# Patient Record
Sex: Female | Born: 1965 | Race: Black or African American | Hispanic: No | Smoking: Never smoker
Health system: Southern US, Community
[De-identification: ages and names within clinical notes are randomized; demographics above are authoritative.]

## PROBLEM LIST (undated history)

## (undated) DIAGNOSIS — I10 Essential (primary) hypertension: Secondary | ICD-10-CM

## (undated) DIAGNOSIS — K219 Gastro-esophageal reflux disease without esophagitis: Secondary | ICD-10-CM

---

## 1997-12-06 ENCOUNTER — Other Ambulatory Visit: Admission: RE | Admit: 1997-12-06 | Discharge: 1997-12-06 | Payer: Self-pay | Admitting: Obstetrics

## 1997-12-08 ENCOUNTER — Ambulatory Visit (HOSPITAL_COMMUNITY): Admission: RE | Admit: 1997-12-08 | Discharge: 1997-12-08 | Payer: Self-pay | Admitting: Obstetrics

## 1998-01-09 ENCOUNTER — Inpatient Hospital Stay (HOSPITAL_COMMUNITY): Admission: AD | Admit: 1998-01-09 | Discharge: 1998-01-09 | Payer: Self-pay | Admitting: Obstetrics

## 1998-04-17 ENCOUNTER — Ambulatory Visit (HOSPITAL_COMMUNITY): Admission: RE | Admit: 1998-04-17 | Discharge: 1998-04-17 | Payer: Self-pay | Admitting: Obstetrics

## 1998-05-08 ENCOUNTER — Ambulatory Visit (HOSPITAL_COMMUNITY): Admission: RE | Admit: 1998-05-08 | Discharge: 1998-05-08 | Payer: Self-pay | Admitting: Obstetrics

## 1998-07-30 ENCOUNTER — Inpatient Hospital Stay (HOSPITAL_COMMUNITY): Admission: AD | Admit: 1998-07-30 | Discharge: 1998-08-02 | Payer: Self-pay | Admitting: Obstetrics

## 2000-08-19 ENCOUNTER — Other Ambulatory Visit: Admission: RE | Admit: 2000-08-19 | Discharge: 2000-08-19 | Payer: Self-pay | Admitting: Obstetrics

## 2000-12-26 ENCOUNTER — Encounter: Payer: Self-pay | Admitting: Obstetrics

## 2000-12-26 ENCOUNTER — Ambulatory Visit (HOSPITAL_COMMUNITY): Admission: RE | Admit: 2000-12-26 | Discharge: 2000-12-26 | Payer: Self-pay | Admitting: Obstetrics

## 2001-03-19 ENCOUNTER — Inpatient Hospital Stay (HOSPITAL_COMMUNITY): Admission: AD | Admit: 2001-03-19 | Discharge: 2001-03-19 | Payer: Self-pay | Admitting: Obstetrics

## 2001-03-24 ENCOUNTER — Inpatient Hospital Stay (HOSPITAL_COMMUNITY): Admission: AD | Admit: 2001-03-24 | Discharge: 2001-03-24 | Payer: Self-pay | Admitting: Obstetrics

## 2001-03-24 ENCOUNTER — Inpatient Hospital Stay (HOSPITAL_COMMUNITY): Admission: AD | Admit: 2001-03-24 | Discharge: 2001-03-26 | Payer: Self-pay | Admitting: Obstetrics

## 2001-05-04 ENCOUNTER — Emergency Department (HOSPITAL_COMMUNITY): Admission: EM | Admit: 2001-05-04 | Discharge: 2001-05-04 | Payer: Self-pay | Admitting: Emergency Medicine

## 2001-05-06 ENCOUNTER — Ambulatory Visit (HOSPITAL_COMMUNITY): Admission: RE | Admit: 2001-05-06 | Discharge: 2001-05-06 | Payer: Self-pay | Admitting: Obstetrics

## 2001-05-07 ENCOUNTER — Emergency Department (HOSPITAL_COMMUNITY): Admission: EM | Admit: 2001-05-07 | Discharge: 2001-05-07 | Payer: Self-pay | Admitting: *Deleted

## 2003-05-17 ENCOUNTER — Other Ambulatory Visit: Admission: RE | Admit: 2003-05-17 | Discharge: 2003-05-17 | Payer: Self-pay | Admitting: Obstetrics and Gynecology

## 2004-08-08 ENCOUNTER — Other Ambulatory Visit: Admission: RE | Admit: 2004-08-08 | Discharge: 2004-08-08 | Payer: Self-pay | Admitting: Obstetrics and Gynecology

## 2005-05-31 ENCOUNTER — Other Ambulatory Visit: Admission: RE | Admit: 2005-05-31 | Discharge: 2005-05-31 | Payer: Self-pay | Admitting: Obstetrics and Gynecology

## 2007-01-09 ENCOUNTER — Emergency Department (HOSPITAL_COMMUNITY): Admission: EM | Admit: 2007-01-09 | Discharge: 2007-01-09 | Payer: Self-pay | Admitting: Emergency Medicine

## 2007-06-06 ENCOUNTER — Emergency Department (HOSPITAL_COMMUNITY): Admission: EM | Admit: 2007-06-06 | Discharge: 2007-06-06 | Payer: Self-pay | Admitting: Family Medicine

## 2008-07-17 ENCOUNTER — Emergency Department (HOSPITAL_COMMUNITY): Admission: EM | Admit: 2008-07-17 | Discharge: 2008-07-17 | Payer: Self-pay | Admitting: Emergency Medicine

## 2010-09-28 ENCOUNTER — Other Ambulatory Visit (HOSPITAL_COMMUNITY)
Admission: RE | Admit: 2010-09-28 | Discharge: 2010-09-28 | Disposition: A | Discharge status: Home / Self Care | Payer: PRIVATE HEALTH INSURANCE | Source: Ambulatory Visit | Attending: Gynecology | Admitting: Gynecology

## 2010-09-28 ENCOUNTER — Encounter (INDEPENDENT_AMBULATORY_CARE_PROVIDER_SITE_OTHER): Payer: PRIVATE HEALTH INSURANCE | Admitting: Gynecology

## 2010-09-28 ENCOUNTER — Other Ambulatory Visit: Payer: Self-pay | Admitting: Gynecology

## 2010-09-28 DIAGNOSIS — Z113 Encounter for screening for infections with a predominantly sexual mode of transmission: Secondary | ICD-10-CM | POA: Insufficient documentation

## 2010-09-28 DIAGNOSIS — Z13220 Encounter for screening for lipoid disorders: Secondary | ICD-10-CM

## 2010-09-28 DIAGNOSIS — R82998 Other abnormal findings in urine: Secondary | ICD-10-CM

## 2010-09-28 DIAGNOSIS — Z124 Encounter for screening for malignant neoplasm of cervix: Secondary | ICD-10-CM | POA: Insufficient documentation

## 2010-09-28 DIAGNOSIS — Z01419 Encounter for gynecological examination (general) (routine) without abnormal findings: Secondary | ICD-10-CM

## 2010-09-28 DIAGNOSIS — Z833 Family history of diabetes mellitus: Secondary | ICD-10-CM

## 2010-11-16 NOTE — Op Note (Signed)
[facility]  Patient:    TWISHA, VANPELT Visit Number: [id number] MRN: [id number]          Service Type: EXP Location: [facility] Attending Physician:  Corlis Leak. Dictated by:   Kathreen Cosier, M.D. Proc. Date: 05/06/01 Admit Date:  05/07/2001                             Operative Report  PREOPERATIVE DIAGNOSES:       Multiparity.  PROCEDURE:                    Open laparoscopic tubal sterilization.  Under general anesthesia with patient in lithotomy position abdomen, perineum, and vagina are prepped and draped.  Bladder emptied with straight catheter.  Hulka tenaculum was placed in the cervix.  In the umbilicus a transverse incision is made, carried down to the fascia.  Fascia cleaned, grasped with two Kochers, and the fascia and peritoneum opened with Mayo scissors.  Sleeve of the trocar inserted intraperitoneally.  Carbon dioxide 3 L infused intraperitoneally. The visualizing scope inserted with camera attached.  The uterus, tubes, and ovaries were normal.  The right tube was grasped 1 inch from the cornua and cauterized.  The tube was cauterized a total of five places moving lateral from the first site of cautery.  Approximately 2 inches of tube cauterized. Procedure done in the exact fashion on the other side.  Probes removed.  CO2 allowed to escape from the peritoneal cavity.  Fascia closed with one stitch of 0 Dexon.  Skin closed with subcuticular stitch of 3-0 plain.  Patient tolerated procedure well.  Taken to recovery room in good condition. Dictated by:   Kathreen Cosier, M.D. Attending Physician:  Corlis Leak DD:  05/06/01 TD:  05/07/01 Job: [id number]

## 2011-03-25 ENCOUNTER — Inpatient Hospital Stay (INDEPENDENT_AMBULATORY_CARE_PROVIDER_SITE_OTHER)
Admission: RE | Admit: 2011-03-25 | Discharge: 2011-03-25 | Disposition: A | Discharge status: Home / Self Care | Payer: PRIVATE HEALTH INSURANCE | Source: Ambulatory Visit | Attending: Emergency Medicine | Admitting: Emergency Medicine

## 2011-03-25 DIAGNOSIS — J309 Allergic rhinitis, unspecified: Secondary | ICD-10-CM

## 2011-04-08 LAB — POCT RAPID STREP A: Streptococcus, Group A Screen (Direct): NEGATIVE

## 2011-10-08 ENCOUNTER — Emergency Department (INDEPENDENT_AMBULATORY_CARE_PROVIDER_SITE_OTHER)
Admission: EM | Admit: 2011-10-08 | Discharge: 2011-10-08 | Disposition: A | Discharge status: Home / Self Care | Payer: PRIVATE HEALTH INSURANCE | Source: Home / Self Care

## 2011-10-08 ENCOUNTER — Encounter (HOSPITAL_COMMUNITY): Payer: Self-pay | Admitting: *Deleted

## 2011-10-08 DIAGNOSIS — M7710 Lateral epicondylitis, unspecified elbow: Secondary | ICD-10-CM

## 2011-10-08 DIAGNOSIS — M7711 Lateral epicondylitis, right elbow: Secondary | ICD-10-CM

## 2011-10-08 MED ORDER — DICLOFENAC SODIUM 75 MG PO TBEC: 75.0000 mg | DELAYED_RELEASE_TABLET | Freq: Two times a day (BID) | ORAL | Status: AC

## 2011-10-08 NOTE — Discharge Instructions (Signed)
Wear tennis elbow strap during daytime hours. Ice your elbow 3-4 times a day for 15-20 minutes. If your discomfort is not improving in 2 weeks, call Dr Carlos Levering office for a follow up appt.  Tennis Elbow Your caregiver has diagnosed you with a condition often referred to as "tennis elbow." This results from small tears or soreness (inflammation) at the start (origin) of the extensor muscles of the forearm. Although the condition is often called tennis or golfer's elbow, it is caused by any repetitive action performed by your elbow. HOME CARE INSTRUCTIONS  If the condition has been short lived, rest may be the only treatment required. Using your opposite hand or arm to perform the task may help. Even changing your grip may help rest the extremity. These may even prevent the condition from recurring.   Longer standing problems, however, will often be relieved faster by:   Using anti-inflammatory agents.   Applying ice packs for 30 minutes at the end of the working day, at bed time, or when activities are finished.   Your caregiver may also have you wear a splint or sling. This will allow the inflamed tendon to heal.  At times, steroid injections aided with a local anesthetic will be required along with splinting for 1 to 2 weeks. Two to three steroid injections will often solve the problem. In some long standing cases, the inflamed tendon does not respond to conservative (non-surgical) therapy. Then surgery may be required to repair it. MAKE SURE YOU:   Understand these instructions.   Will watch your condition.   Will get help right away if you are not doing well or get worse.  Document Released: 06/17/2005 Document Revised: 06/06/2011 Document Reviewed: 02/03/2008 [facility] Patient Information 2012 [place], [place].

## 2011-10-08 NOTE — ED Notes (Signed)
Pt reports right arm pain the last month.  Denies recent injuries.

## 2011-10-08 NOTE — ED Provider Notes (Signed)
History     CSN: [id number]  Arrival date & time 10/08/11  1332   None     Chief Complaint  Patient presents with  . Arm Pain    (Consider location/radiation/quality/duration/timing/severity/associated sxs/prior treatment) HPI Comments: Patient presents today with complaints of right lateral elbow pain for approximately one month. Pain is worse with use of her arm, especially trying to pick things up. She is right-hand dominant. She denies injury, but her work does involve repetitive use of bilateral upper extremities. She has been taking over-the-counter Aleve with mild improvement.   History reviewed. No pertinent past medical history.  History reviewed. No pertinent past surgical history.  Family History  Problem Relation Age of Onset  . Diabetes Mother   . Hypertension Mother   . Diabetes Father   . Hypertension Father     History  Substance Use Topics  . Smoking status: Never Smoker   . Smokeless tobacco: Not on file  . Alcohol Use: No    OB History    Grav Para Term Preterm Abortions TAB SAB Ect Mult Living                  Review of Systems  Musculoskeletal: Negative for joint swelling.  Skin: Negative for color change and wound.  Neurological: Negative for numbness.    Allergies  Review of patient's allergies indicates no known allergies.  Home Medications   Current Outpatient Rx  Name Route Sig Dispense Refill  . DICLOFENAC SODIUM 75 MG PO TBEC Oral Take 1 tablet (75 mg total) by mouth 2 (two) times daily. 20 tablet 0    BP 130/84  Pulse 81  Temp(Src) 98.8 F (37.1 C) (Oral)  Resp 12  SpO2 98%  LMP 09/24/2011  Physical Exam  Nursing note and vitals reviewed. Constitutional: She appears well-developed and well-nourished. No distress.  Cardiovascular:  Pulses:      Radial pulses are 2+ on the right side.       RUE cap refill < 2 sec  Musculoskeletal:       Right elbow: She exhibits normal range of motion, no swelling, no effusion and no  deformity. tenderness found. Lateral epicondyle tenderness noted. No radial head, no medial epicondyle and no olecranon process tenderness noted.  Neurological: She is alert.  Skin: Skin is warm and dry.  Psychiatric: She has a normal mood and affect.    ED Course  Procedures (including critical care time)  Labs Reviewed - No data to display No results found.   1. Lateral epicondylitis of right elbow       MDM   Conservative treatment of Rt lateral epicondylitis with tennis elbow strap, ice and NSAID. If symptoms persists in 2 wks without improvement to contact Dr Brunetta Genera office for f/u.        Melody Comas, [place] 10/08/11 [phone]

## 2011-10-09 NOTE — ED Provider Notes (Signed)
Medical screening examination/treatment/procedure(s) were performed by non-physician practitioner and as supervising physician I was immediately available for consultation/collaboration.   [facility]; MD   Sharin Grave, MD 10/09/11 [phone]

## 2013-02-08 ENCOUNTER — Emergency Department (HOSPITAL_COMMUNITY)
Admission: EM | Admit: 2013-02-08 | Discharge: 2013-02-08 | Disposition: A | Discharge status: Home / Self Care | Payer: Commercial Managed Care - PPO | Source: Home / Self Care

## 2013-02-08 ENCOUNTER — Encounter (HOSPITAL_COMMUNITY): Payer: Self-pay | Admitting: Emergency Medicine

## 2013-02-08 DIAGNOSIS — N390 Urinary tract infection, site not specified: Secondary | ICD-10-CM

## 2013-02-08 LAB — POCT URINALYSIS DIP (DEVICE)
Ketones, ur: NEGATIVE mg/dL
Protein, ur: NEGATIVE mg/dL
Specific Gravity, Urine: 1.015 (ref 1.005–1.030)

## 2013-02-08 LAB — POCT PREGNANCY, URINE: Preg Test, Ur: NEGATIVE

## 2013-02-08 MED ORDER — CEPHALEXIN 500 MG PO CAPS: 500.0000 mg | ORAL_CAPSULE | Freq: Two times a day (BID) | ORAL | Status: DC

## 2013-02-08 NOTE — ED Provider Notes (Signed)
Kelly Newton is a 47 y.o. female who presents to Urgent Care today for possible UTI. Patient has had 2 weeks of urinary frequency urgency and burning. She denies any abdominal pain back pain nausea vomiting diarrhea or fever. She has not tried any medications yet. She feels well otherwise. The symptoms are consistent with prior episodes of UTI.   PMH reviewed. Healthy otherwise History  Substance Use Topics  . Smoking status: Never Smoker   . Smokeless tobacco: Not on file  . Alcohol Use: No   ROS as above Medications reviewed. No current facility-administered medications for this encounter.   Current Outpatient Prescriptions  Medication Sig Dispense Refill  . cephALEXin (KEFLEX) 500 MG capsule Take 1 capsule (500 mg total) by mouth 2 (two) times daily.  14 capsule  0    Exam:  BP 121/109  Pulse 89  Temp(Src) 99.5 F (37.5 C) (Oral)  Resp 16  SpO2 99%  LMP 01/16/2013 Gen: Well NAD HEENT: EOMI,  MMM Lungs: CTABL Nl WOB Heart: RRR no MRG Abd: NABS, NT, ND Exts: Non edematous BL  LE, warm and well perfused.   Results for orders placed during the hospital encounter of 02/08/13 (from the past 24 hour(s))  POCT URINALYSIS DIP (DEVICE)     Status: Abnormal   Collection Time    02/08/13  8:19 PM      Result Value Range   Glucose, UA NEGATIVE  NEGATIVE mg/dL   Bilirubin Urine NEGATIVE  NEGATIVE   Ketones, ur NEGATIVE  NEGATIVE mg/dL   Specific Gravity, Urine 1.015  1.005 - 1.030   Hgb urine dipstick MODERATE (*) NEGATIVE   pH 5.5  5.0 - 8.0   Protein, ur NEGATIVE  NEGATIVE mg/dL   Urobilinogen, UA 0.2  0.0 - 1.0 mg/dL   Nitrite NEGATIVE  NEGATIVE   Leukocytes, UA SMALL (*) NEGATIVE  POCT PREGNANCY, URINE     Status: None   Collection Time    02/08/13  8:27 PM      Result Value Range   Preg Test, Ur NEGATIVE  NEGATIVE   No results found.  Assessment and Plan: 47 y.o. female with UTI.  Plan to treat empirically with Keflex.  Followup if not improving.  Discussed  warning signs or symptoms. Please see discharge instructions. Patient expresses understanding.      Rodolph Bong, MD 02/08/13 2044

## 2013-02-08 NOTE — ED Notes (Signed)
C/o UTI.  Urinary frequency and little blood in urine.  Strong odor when urinating.

## 2013-07-09 ENCOUNTER — Emergency Department (HOSPITAL_COMMUNITY)
Admission: EM | Admit: 2013-07-09 | Discharge: 2013-07-09 | Disposition: A | Discharge status: Home / Self Care | Payer: Commercial Managed Care - PPO | Source: Home / Self Care | Attending: Family Medicine | Admitting: Family Medicine

## 2013-07-09 ENCOUNTER — Encounter (HOSPITAL_COMMUNITY): Payer: Self-pay | Admitting: Emergency Medicine

## 2013-07-09 DIAGNOSIS — I10 Essential (primary) hypertension: Secondary | ICD-10-CM

## 2013-07-09 DIAGNOSIS — G44209 Tension-type headache, unspecified, not intractable: Secondary | ICD-10-CM

## 2013-07-09 MED ORDER — BUTALBITAL-APAP-CAFFEINE 50-325-40 MG PO TABS: 1.0000 | ORAL_TABLET | Freq: Four times a day (QID) | ORAL | Status: DC | PRN

## 2013-07-09 MED ORDER — KETOROLAC TROMETHAMINE 60 MG/2ML IM SOLN
INTRAMUSCULAR | Status: AC
  Filled 2013-07-09: qty 2

## 2013-07-09 MED ORDER — DEXAMETHASONE SODIUM PHOSPHATE 10 MG/ML IJ SOLN
INTRAMUSCULAR | Status: AC
  Filled 2013-07-09: qty 1

## 2013-07-09 MED ORDER — KETOROLAC TROMETHAMINE 60 MG/2ML IM SOLN
60.0000 mg | Freq: Once | INTRAMUSCULAR | Status: AC
  Administered 2013-07-09: 60 mg via INTRAMUSCULAR

## 2013-07-09 MED ORDER — DEXAMETHASONE SODIUM PHOSPHATE 10 MG/ML IJ SOLN
10.0000 mg | Freq: Once | INTRAMUSCULAR | Status: AC
  Administered 2013-07-09: 10 mg via INTRAMUSCULAR

## 2013-07-09 NOTE — ED Notes (Signed)
Pt  Reports  Headache  X  5  Days   No  History  Of  Any  Similar  Episodes   -  denys  Any  Nausea  /  Vomiting   -  Slight  Blurred  Vision            Pt  Reports          Partial  releif   With otc  meds

## 2013-07-09 NOTE — ED Provider Notes (Signed)
CSN: [id number]     Arrival date & time 07/09/13  1430 History   First MD Initiated Contact with Patient 07/09/13 1607     Chief Complaint  Patient presents with  . Headache   (Consider location/radiation/quality/duration/timing/severity/associated sxs/prior Treatment) HPI Comments: 48 year old female presents complaining of HA for 5 days, waxes and wains, checked BP @ pharmacy and it was very high, the readout told her to come here.  She also admits to Slight blurry vision when HA gets worse.  No other sxs . She has no significant history of prolonged headaches. The headache is in a bandlike distribution across her forehead. It is not associated with any nausea, vomiting, lightheadedness. It was gradual in onset.  Patient is a 48 y.o. female presenting with headaches.  Headache Associated symptoms: no abdominal pain, no cough, no dizziness, no fever, no myalgias, no nausea and no vomiting     History reviewed. No pertinent past medical history. History reviewed. No pertinent past surgical history. Family History  Problem Relation Age of Onset  . Diabetes Mother   . Hypertension Mother   . Diabetes Father   . Hypertension Father    History  Substance Use Topics  . Smoking status: Never Smoker   . Smokeless tobacco: Not on file  . Alcohol Use: No   OB History   Grav Para Term Preterm Abortions TAB SAB Ect Mult Living                 Review of Systems  Constitutional: Negative for fever and chills.  Eyes: Positive for visual disturbance.  Respiratory: Negative for cough and shortness of breath.   Cardiovascular: Negative for chest pain, palpitations and leg swelling.  Gastrointestinal: Negative for nausea, vomiting and abdominal pain.  Endocrine: Negative for polydipsia and polyuria.  Genitourinary: Negative for dysuria, urgency and frequency.  Musculoskeletal: Negative for arthralgias and myalgias.  Skin: Negative for rash.  Neurological: Positive for headaches. Negative  for dizziness, tremors, syncope, facial asymmetry, speech difficulty, weakness and light-headedness.    Allergies  Review of patient's allergies indicates no known allergies.  Home Medications   Current Outpatient Rx  Name  Route  Sig  Dispense  Refill  . butalbital-acetaminophen-caffeine (FIORICET) 50-325-40 MG per tablet   Oral   Take 1-2 tablets by mouth every 6 (six) hours as needed for headache.   20 tablet   0   . cephALEXin (KEFLEX) 500 MG capsule   Oral   Take 1 capsule (500 mg total) by mouth 2 (two) times daily.   14 capsule   0    BP 166/93  Pulse 90  Temp(Src) 98.3 F (36.8 C) (Oral)  Resp 18  SpO2 100%  LMP 06/27/2013 Physical Exam  Nursing note and vitals reviewed. Constitutional: She is oriented to person, place, and time. Vital signs are normal. She appears well-developed and well-nourished. No distress.  HENT:  Head: Normocephalic and atraumatic.  Right Ear: External ear normal.  Left Ear: External ear normal.  Nose: Nose normal. Right sinus exhibits no maxillary sinus tenderness and no frontal sinus tenderness. Left sinus exhibits no maxillary sinus tenderness and no frontal sinus tenderness.  Mouth/Throat: No oropharyngeal exudate.  Cardiovascular: Normal rate, regular rhythm and normal heart sounds.  Exam reveals no gallop and no friction rub.   No murmur heard. Pulmonary/Chest: Effort normal and breath sounds normal. No respiratory distress. She has no wheezes. She has no rales.  Neurological: She is alert and oriented to person, place, and time.  She has normal strength and normal reflexes. She displays normal reflexes. No cranial nerve deficit. She exhibits normal muscle tone. Coordination normal.  Skin: Skin is warm and dry. No rash noted. She is not diaphoretic.  Psychiatric: She has a normal mood and affect. Judgment normal.    ED Course  Procedures (including critical care time) Labs Review Labs Reviewed - No data to display Imaging  Review No results found.    MDM   1. Tension headache   2. High blood pressure    Tension headache, possibly due to pressure or the high blood pressure may be due to the headache.   treating the headache for now, she will visit the pharmacy daily and monitor blood pressures over the next couple of days. If headache persists and her blood pressure remains high, she can return here on Sunday and i will consider starting her on a blood pressure medication and work on getting her set up in primary care. We'll probably start on amlodipine   Meds ordered this encounter  Medications  . ketorolac (TORADOL) injection 60 mg    Sig:   . dexamethasone (DECADRON) injection 10 mg    Sig:   . butalbital-acetaminophen-caffeine (FIORICET) 50-325-40 MG per tablet    Sig: Take 1-2 tablets by mouth every 6 (six) hours as needed for headache.    Dispense:  20 tablet    Refill:  0    Order Specific Question:  Supervising Provider    Answer:  Bradd CanaryKINDL, JAMES D [[id number]]     Graylon GoodZachary H Devery Murgia, PA-C 07/10/13 1120

## 2013-07-09 NOTE — Discharge Instructions (Signed)

## 2013-07-12 NOTE — ED Provider Notes (Signed)
Medical screening examination/treatment/procedure(s) were performed by resident physician or non-physician practitioner and as supervising physician I was immediately available for consultation/collaboration.   KINDL,JAMES DOUGLAS MD.   James D Kindl, MD 07/12/13 1437 

## 2013-09-07 ENCOUNTER — Encounter (HOSPITAL_COMMUNITY): Payer: Self-pay | Admitting: Emergency Medicine

## 2013-09-07 ENCOUNTER — Emergency Department (HOSPITAL_COMMUNITY)
Admission: EM | Admit: 2013-09-07 | Discharge: 2013-09-07 | Disposition: A | Discharge status: Home / Self Care | Payer: Commercial Managed Care - PPO | Source: Home / Self Care | Attending: Family Medicine | Admitting: Family Medicine

## 2013-09-07 DIAGNOSIS — R599 Enlarged lymph nodes, unspecified: Secondary | ICD-10-CM

## 2013-09-07 DIAGNOSIS — R591 Generalized enlarged lymph nodes: Secondary | ICD-10-CM

## 2013-09-07 LAB — POCT RAPID STREP A: STREPTOCOCCUS, GROUP A SCREEN (DIRECT): NEGATIVE

## 2013-09-07 LAB — CBC WITH DIFFERENTIAL/PLATELET
BASOS ABS: 0 10*3/uL (ref 0.0–0.1)
Basophils Relative: 0 % (ref 0–1)
EOS ABS: 0 10*3/uL (ref 0.0–0.7)
EOS PCT: 1 % (ref 0–5)
HCT: 34.7 % — ABNORMAL LOW (ref 36.0–46.0)
Hemoglobin: 12.1 g/dL (ref 12.0–15.0)
Lymphocytes Relative: 39 % (ref 12–46)
Lymphs Abs: 2.2 10*3/uL (ref 0.7–4.0)
MCH: 29.7 pg (ref 26.0–34.0)
MCHC: 34.9 g/dL (ref 30.0–36.0)
MCV: 85 fL (ref 78.0–100.0)
Monocytes Absolute: 0.6 10*3/uL (ref 0.1–1.0)
Monocytes Relative: 10 % (ref 3–12)
NEUTROS PCT: 50 % (ref 43–77)
Neutro Abs: 2.8 10*3/uL (ref 1.7–7.7)
PLATELETS: 289 10*3/uL (ref 150–400)
RBC: 4.08 MIL/uL (ref 3.87–5.11)
RDW: 14 % (ref 11.5–15.5)
WBC: 5.6 10*3/uL (ref 4.0–10.5)

## 2013-09-07 MED ORDER — CLINDAMYCIN HCL 300 MG PO CAPS: 300.0000 mg | ORAL_CAPSULE | Freq: Four times a day (QID) | ORAL | Status: DC

## 2013-09-07 NOTE — ED Notes (Signed)
Patient complains of pain and swelling just under right jaw line; states pain is worse when eating and talking; pain to touch; states started a week ago.

## 2013-09-07 NOTE — Discharge Instructions (Signed)
Lymphadenopathy °Lymphadenopathy means "disease of the lymph glands." But the term is usually used to describe swollen or enlarged lymph glands, also called lymph nodes. These are the bean-shaped organs found in many locations including the neck, underarm, and groin. Lymph glands are part of the immune system, which fights infections in your body. Lymphadenopathy can occur in just one area of the body, such as the neck, or it can be generalized, with lymph node enlargement in several areas. The nodes found in the neck are the most common sites of lymphadenopathy. °CAUSES  °When your immune system responds to germs (such as viruses or bacteria ), infection-fighting cells and fluid build up. This causes the glands to grow in size. This is usually not something to worry about. Sometimes, the glands themselves can become infected and inflamed. This is called lymphadenitis. °Enlarged lymph nodes can be caused by many diseases: °· Bacterial disease, such as strep throat or a skin infection. °· Viral disease, such as a common cold. °· Other germs, such as lyme disease, tuberculosis, or sexually transmitted diseases. °· Cancers, such as lymphoma (cancer of the lymphatic system) or leukemia (cancer of the white blood cells). °· Inflammatory diseases such as lupus or rheumatoid arthritis. °· Reactions to medications. °Many of the diseases above are rare, but important. This is why you should see your caregiver if you have lymphadenopathy. °SYMPTOMS  °· Swollen, enlarged lumps in the neck, back of the head or other locations. °· Tenderness. °· Warmth or redness of the skin over the lymph nodes. °· Fever. °DIAGNOSIS  °Enlarged lymph nodes are often near the source of infection. They can help healthcare providers diagnose your illness. For instance:  °· Swollen lymph nodes around the jaw might be caused by an infection in the mouth. °· Enlarged glands in the neck often signal a throat infection. °· Lymph nodes that are swollen  in more than one area often indicate an illness caused by a virus. °Your caregiver most likely will know what is causing your lymphadenopathy after listening to your history and examining you. Blood tests, x-rays or other tests may be needed. If the cause of the enlarged lymph node cannot be found, and it does not go away by itself, then a biopsy may be needed. Your caregiver will discuss this with you. °TREATMENT  °Treatment for your enlarged lymph nodes will depend on the cause. Many times the nodes will shrink to normal size by themselves, with no treatment. Antibiotics or other medicines may be needed for infection. Only take over-the-counter or prescription medicines for pain, discomfort or fever as directed by your caregiver. °HOME CARE INSTRUCTIONS  °Swollen lymph glands usually return to normal when the underlying medical condition goes away. If they persist, contact your health-care provider. He/she might prescribe antibiotics or other treatments, depending on the diagnosis. Take any medications exactly as prescribed. Keep any follow-up appointments made to check on the condition of your enlarged nodes.  °SEEK MEDICAL CARE IF:  °· Swelling lasts for more than two weeks. °· You have symptoms such as weight loss, night sweats, fatigue or fever that does not go away. °· The lymph nodes are hard, seem fixed to the skin or are growing rapidly. °· Skin over the lymph nodes is red and inflamed. This could mean there is an infection. °SEEK IMMEDIATE MEDICAL CARE IF:  °· Fluid starts leaking from the area of the enlarged lymph node. °· You develop a fever of 102° F (38.9° C) or greater. °· Severe   pain develops (not necessarily at the site of a large lymph node). °· You develop chest pain or shortness of breath. °· You develop worsening abdominal pain. °MAKE SURE YOU:  °· Understand these instructions. °· Will watch your condition. °· Will get help right away if you are not doing well or get worse. °Document  Released: 03/26/2008 Document Revised: 09/09/2011 Document Reviewed: 03/26/2008 °[employer]® Patient Information ©2014 [employer]. ° °

## 2013-09-07 NOTE — ED Provider Notes (Signed)
CSN: [id number]     Arrival date & time 09/07/13  1649 History   First MD Initiated Contact with Patient 09/07/13 1828     Chief Complaint  Patient presents with  . Jaw Pain   (Consider location/radiation/quality/duration/timing/severity/associated sxs/prior Treatment) HPI Comments: Reports one week history of single tender swollen right anterior cervical lymph node. Endorses associated tender swollen area under right side of her tongue. Denies any recent illness or injury. Denies dental issues, fever, URI sx, sore throat.  Reports remote episode of same several years ago that resolved spontaneously.   The history is provided by the patient.    History reviewed. No pertinent past medical history. History reviewed. No pertinent past surgical history. Family History  Problem Relation Age of Onset  . Diabetes Mother   . Hypertension Mother   . Diabetes Father   . Hypertension Father    History  Substance Use Topics  . Smoking status: Never Smoker   . Smokeless tobacco: Not on file  . Alcohol Use: No   OB History   Grav Para Term Preterm Abortions TAB SAB Ect Mult Living                 Review of Systems  Constitutional: Negative for fever, chills, appetite change, fatigue and unexpected weight change.  HENT: Negative.   Eyes: Negative.   Respiratory: Negative.   Cardiovascular: Negative.   Gastrointestinal: Negative.   Endocrine: Negative.   Genitourinary: Negative.   Musculoskeletal: Negative.   Skin: Negative.   Allergic/Immunologic: Negative.   Neurological: Negative.   Hematological: Positive for adenopathy. Does not bruise/bleed easily.    Allergies  Review of patient's allergies indicates no known allergies.  Home Medications   Current Outpatient Rx  Name  Route  Sig  Dispense  Refill  . butalbital-acetaminophen-caffeine (FIORICET) 50-325-40 MG per tablet   Oral   Take 1-2 tablets by mouth every 6 (six) hours as needed for headache.   20 tablet   0    . cephALEXin (KEFLEX) 500 MG capsule   Oral   Take 1 capsule (500 mg total) by mouth 2 (two) times daily.   14 capsule   0   . clindamycin (CLEOCIN) 300 MG capsule   Oral   Take 1 capsule (300 mg total) by mouth 4 (four) times daily. X 7 days   28 capsule   0    BP 144/88  Pulse 91  Temp(Src) 99.2 F (37.3 C) (Oral)  Resp 18  SpO2 98%  LMP 08/25/2013 Physical Exam  Nursing note and vitals reviewed. Constitutional: She is oriented to person, place, and time. She appears well-developed and well-nourished. No distress.  HENT:  Head: Normocephalic and atraumatic.  Right Ear: Hearing, tympanic membrane, external ear and ear canal normal.  Left Ear: Hearing, tympanic membrane, external ear and ear canal normal.  Nose: Nose normal.  Mouth/Throat: Oropharynx is clear and moist and mucous membranes are normal. She does not have dentures. Oral lesions present. No trismus in the jaw. Normal dentition. No dental abscesses, uvula swelling, lacerations or dental caries.  0.5 cm right sided buccinator that is tender and slightly firm to the touch  Eyes: Conjunctivae are normal. Right eye exhibits no discharge. Left eye exhibits no discharge. No scleral icterus.  Neck: Normal range of motion. Neck supple. No thyromegaly present.  1cm firm mobile, tender right sided anterior cervical lymph node  Cardiovascular: Normal rate, regular rhythm and normal heart sounds.   Pulmonary/Chest: Effort normal and breath  sounds normal. No respiratory distress. She has no wheezes.  Abdominal: Soft. Bowel sounds are normal. She exhibits no distension. There is no tenderness.  Musculoskeletal: Normal range of motion.  Neurological: She is alert and oriented to person, place, and time.  Skin: Skin is warm and dry.  Psychiatric: She has a normal mood and affect. Her behavior is normal.    ED Course  Procedures (including critical care time) Labs Review Labs Reviewed  CBC WITH DIFFERENTIAL - Abnormal;  Notable for the following:    HCT 34.7 (*)    All other components within normal limits  CULTURE, GROUP A STREP  POCT RAPID STREP A ([facility] CARE ONLY)   Imaging Review No results found.   MDM   1. Lymphadenopathy    Right anterior cervical and buccinator lymphadenopathy: differential dx includes reactive lymphadenopathy, lymphadenitis, and neoplasm. Will initiate treatment with oral clindamycin and [facility].    Jess BartersJennifer Lee ButlerPresson, [place] 09/08/13 [phone]

## 2013-09-08 NOTE — ED Provider Notes (Signed)
Medical screening examination/treatment/procedure(s) were performed by a resident physician or non-physician practitioner and as the supervising physician I was immediately available for consultation/collaboration.  Terren Haberle, MD    Mahika Vanvoorhis S Virgal Warmuth, MD 09/08/13 2114 

## 2013-09-09 LAB — CULTURE, GROUP A STREP

## 2013-09-24 ENCOUNTER — Other Ambulatory Visit (HOSPITAL_COMMUNITY): Payer: Self-pay | Admitting: Otolaryngology

## 2013-09-24 DIAGNOSIS — K1120 Sialoadenitis, unspecified: Secondary | ICD-10-CM

## 2013-09-24 DIAGNOSIS — R220 Localized swelling, mass and lump, head: Secondary | ICD-10-CM

## 2013-09-24 DIAGNOSIS — R221 Localized swelling, mass and lump, neck: Secondary | ICD-10-CM

## 2013-09-29 ENCOUNTER — Ambulatory Visit (HOSPITAL_COMMUNITY): Admission: RE | Admit: 2013-09-29 | Payer: Commercial Managed Care - PPO | Source: Ambulatory Visit

## 2013-10-01 ENCOUNTER — Encounter (HOSPITAL_COMMUNITY): Payer: Self-pay | Admitting: Pharmacy Technician

## 2013-10-06 NOTE — Pre-Procedure Instructions (Signed)
Kelly Newton  10/06/2013   Your procedure is scheduled on:  Friday October 15, 2013 at 7:30 AM.  Report to [facility] Short Stay Entrance "A" Admitting at 5:30 AM.  Call this number if you have problems the morning of surgery: [phone]   Remember:   Do not eat food or drink liquids after midnight.   Take these medicines the morning of surgery with A SIP OF WATER: NONE   Do not wear jewelry, make-up or nail polish.  Do not wear lotions, powders, or perfumes.  Do not shave 48 hours prior to surgery.   Do not bring valuables to the hospital.  [facility] is not responsible for any belongings or valuables.               Contacts, dentures or bridgework may not be worn into surgery.  Leave suitcase in the car. After surgery it may be brought to your room.  For patients admitted to the hospital, discharge time is determined by your treatment team.               Patients discharged the day of surgery will not be allowed to drive home.  Name and phone number of your driver: Family/Friend  Special Instructions: Shower using CHG soap the night before and the morning of your surgery   Please read over the following fact sheets that you were given: Pain Booklet, Coughing and Deep Breathing and Surgical Site Infection Prevention

## 2013-10-07 ENCOUNTER — Encounter (HOSPITAL_COMMUNITY): Payer: Self-pay

## 2013-10-07 ENCOUNTER — Encounter (HOSPITAL_COMMUNITY)
Admission: RE | Admit: 2013-10-07 | Discharge: 2013-10-07 | Disposition: A | Discharge status: Home / Self Care | Payer: Commercial Managed Care - PPO | Source: Ambulatory Visit | Attending: Otolaryngology | Admitting: Otolaryngology

## 2013-10-07 DIAGNOSIS — Z01812 Encounter for preprocedural laboratory examination: Secondary | ICD-10-CM | POA: Insufficient documentation

## 2013-10-07 HISTORY — DX: Gastro-esophageal reflux disease without esophagitis: K21.9

## 2013-10-07 LAB — BASIC METABOLIC PANEL
BUN: 8 mg/dL (ref 6–23)
CO2: 24 mEq/L (ref 19–32)
Calcium: 9.4 mg/dL (ref 8.4–10.5)
Chloride: 102 mEq/L (ref 96–112)
Creatinine, Ser: 0.63 mg/dL (ref 0.50–1.10)
GFR calc non Af Amer: >90 mL/min (ref >90)
Glucose, Bld: 85 mg/dL (ref 70–99)
POTASSIUM: 3.9 meq/L (ref 3.7–5.3)
Sodium: 141 mEq/L (ref 137–147)

## 2013-10-07 LAB — CBC
HCT: 34.4 % — ABNORMAL LOW (ref 36.0–46.0)
HEMOGLOBIN: 11.7 g/dL — AB (ref 12.0–15.0)
MCH: 29.5 pg (ref 26.0–34.0)
MCHC: 34 g/dL (ref 30.0–36.0)
MCV: 86.6 fL (ref 78.0–100.0)
PLATELETS: 289 10*3/uL (ref 150–400)
RBC: 3.97 MIL/uL (ref 3.87–5.11)
RDW: 14.4 % (ref 11.5–15.5)
WBC: 8.2 10*3/uL (ref 4.0–10.5)

## 2013-10-07 LAB — HCG, SERUM, QUALITATIVE: Preg, Serum: NEGATIVE

## 2013-10-07 NOTE — Progress Notes (Signed)
[facility] Ellyn [facility]Gore's office, reported pt. Has not had CT  Scan as planned because of schedule conflict.  Dr. Emeline DarlingGore out of office today, will be addressed with him on Monday & French Anaracy will [phone] to inform us  of what is necessary before surgery .

## 2013-10-14 NOTE — H&P (Signed)
10/15/13 7:08 AM  Kelly Newton  PREOPERATIVE HISTORY AND PHYSICAL  CHIEF COMPLAINT: right submandibular duct stone/sialoadenitis/sialolithiasis  HISTORY: This is a 48 year old who presents with right submandibular duct stone/sialoadenitis/sialolithiasis.  She now presents for sialodochoplasty/right submandibular duct sialolithotomy.  Dr. Melvenia Kelly Jevonte Newton has discussed the risks (bleeding, recurrence, duct stenosis, injury, tongue numbness or paralysis, infection, scarring, need for revision surgery, etc., benefits, and alternatives of this procedure. The patient declined a pre-op ultrasound or CT to assess for additional stones and she understands that this may mean she would need additional proceedures in the future. The patient understands the risks and would like to proceed with the procedure. The chances of success of the procedure are >50% and the patient understands this. I personally performed an examination of the patient within 24 hours of the procedure.  PAST MEDICAL HISTORY: Past Medical History  Diagnosis Date  . GERD (gastroesophageal reflux disease)   .   PAST SURGICAL HISTORY: Past Surgical History  Procedure Laterality Date  . Cesarean section  1996    /w epidural    MEDICATIONS: No current facility-administered medications on file prior to encounter.   No current outpatient prescriptions on file prior to encounter.    ALLERGIES: No Known Allergies    SOCIAL HISTORY: History   Social History  . Marital Status: Divorced    Spouse Name: N/A    Number of Children: N/A  . Years of Education: N/A   Occupational History  . Not on file.   Social History Main Topics  . Smoking status: Never Smoker   . Smokeless tobacco: Not on file  . Alcohol Use: No  . Drug Use: No  . Sexual Activity: Yes    Birth Control/ Protection: Surgical   Other Topics Concern  . Not on file   Social History Narrative  . No narrative on file    FAMILY HISTORY: Family History   Problem Relation Age of Onset  . Diabetes Mother   . Hypertension Mother   . Diabetes Father   . Hypertension Father     REVIEW OF SYSTEMS:  HEENT: right submandibular pain/swelling, otherwise negative x 12 systems except per HPI   PHYSICAL EXAM:  GENERAL: NAD  VITAL SIGNS:  There were no vitals filed for this visit. SKIN:  Warm, dry HEENT:  There is a visible/palpable ~0.25 to 0.5cm right submandibular duct/Wharton's duct stone NECK:  Supple other than mild edema/enlargement of the right submandibular gland. ABDOMEN:  soft MUSCULOSKELETAL: normal affect PSYCH:  Normal affect NEUROLOGIC:  CN 2-12 intact and symmetric  DIAGNOSTIC STUDIES: patient declined CT neck or ultrasound neck  ASSESSMENT AND PLAN: Plan to proceed with right sialodochoplasty/sialolithotomy. Patient understands the risks, benefits, and alternatives. Informed written consent signed witnessed and on chart. 10/15/13 Kelly Newton 7:08 AM

## 2013-10-15 ENCOUNTER — Encounter (HOSPITAL_COMMUNITY): Payer: Self-pay | Admitting: *Deleted

## 2013-10-15 ENCOUNTER — Encounter (HOSPITAL_COMMUNITY): Payer: Commercial Managed Care - PPO | Admitting: Certified Registered"

## 2013-10-15 ENCOUNTER — Ambulatory Visit (HOSPITAL_COMMUNITY)
Admission: RE | Admit: 2013-10-15 | Discharge: 2013-10-15 | Disposition: A | Discharge status: Home / Self Care | Payer: Commercial Managed Care - PPO | Source: Ambulatory Visit | Attending: Otolaryngology | Admitting: Otolaryngology

## 2013-10-15 ENCOUNTER — Encounter (HOSPITAL_COMMUNITY)
Admission: RE | Disposition: A | Discharge status: Home / Self Care | Payer: Self-pay | Source: Ambulatory Visit | Attending: Otolaryngology

## 2013-10-15 ENCOUNTER — Ambulatory Visit (HOSPITAL_COMMUNITY): Payer: Commercial Managed Care - PPO | Admitting: Certified Registered"

## 2013-10-15 DIAGNOSIS — K1120 Sialoadenitis, unspecified: Secondary | ICD-10-CM | POA: Insufficient documentation

## 2013-10-15 DIAGNOSIS — K219 Gastro-esophageal reflux disease without esophagitis: Secondary | ICD-10-CM | POA: Insufficient documentation

## 2013-10-15 HISTORY — PX: SUBMANDIBULAR GLAND EXCISION: SHX2456

## 2013-10-15 SURGERY — EXCISION, SUBMANDIBULAR GLAND
Anesthesia: General | Site: Mouth | Laterality: Right

## 2013-10-15 MED ORDER — ONDANSETRON HCL 4 MG/2ML IJ SOLN
INTRAMUSCULAR | Status: AC
  Filled 2013-10-15: qty 2

## 2013-10-15 MED ORDER — HYDROMORPHONE HCL PF 1 MG/ML IJ SOLN: 0.2500 mg | INTRAMUSCULAR | Status: DC | PRN

## 2013-10-15 MED ORDER — NEOSTIGMINE METHYLSULFATE 1 MG/ML IJ SOLN
INTRAMUSCULAR | Status: DC | PRN
  Administered 2013-10-15: 3 mg via INTRAVENOUS

## 2013-10-15 MED ORDER — ONDANSETRON HCL 4 MG/2ML IJ SOLN
INTRAMUSCULAR | Status: DC | PRN
  Administered 2013-10-15: 4 mg via INTRAVENOUS

## 2013-10-15 MED ORDER — NEOSTIGMINE METHYLSULFATE 1 MG/ML IJ SOLN
INTRAMUSCULAR | Status: AC
  Filled 2013-10-15: qty 10

## 2013-10-15 MED ORDER — PROPOFOL 10 MG/ML IV BOLUS
INTRAVENOUS | Status: DC | PRN
  Administered 2013-10-15: 20 mg via INTRAVENOUS
  Administered 2013-10-15: 200 mg via INTRAVENOUS

## 2013-10-15 MED ORDER — LACTATED RINGERS IV SOLN
INTRAVENOUS | Status: DC | PRN
  Administered 2013-10-15: 07:00:00 via INTRAVENOUS

## 2013-10-15 MED ORDER — MIDAZOLAM HCL 2 MG/2ML IJ SOLN
INTRAMUSCULAR | Status: AC
  Filled 2013-10-15: qty 2

## 2013-10-15 MED ORDER — DEXAMETHASONE SODIUM PHOSPHATE 10 MG/ML IJ SOLN
INTRAMUSCULAR | Status: AC
  Filled 2013-10-15: qty 1

## 2013-10-15 MED ORDER — GLYCOPYRROLATE 0.2 MG/ML IJ SOLN
INTRAMUSCULAR | Status: DC | PRN
  Administered 2013-10-15: 0.4 mg via INTRAVENOUS

## 2013-10-15 MED ORDER — CLINDAMYCIN PHOSPHATE 600 MG/50ML IV SOLN
INTRAVENOUS | Status: AC
  Filled 2013-10-15: qty 50

## 2013-10-15 MED ORDER — OXYMETAZOLINE HCL 0.05 % NA SOLN
NASAL | Status: AC
  Filled 2013-10-15: qty 15

## 2013-10-15 MED ORDER — PROPOFOL 10 MG/ML IV BOLUS
INTRAVENOUS | Status: AC
  Filled 2013-10-15: qty 20

## 2013-10-15 MED ORDER — LIDOCAINE-EPINEPHRINE 1 %-1:100000 IJ SOLN
INTRAMUSCULAR | Status: DC | PRN
Start: 2013-10-15 — End: 2013-10-15
  Administered 2013-10-15: 20 mL

## 2013-10-15 MED ORDER — DEXAMETHASONE SODIUM PHOSPHATE 10 MG/ML IJ SOLN
INTRAMUSCULAR | Status: DC | PRN
  Administered 2013-10-15: 10 mg via INTRAVENOUS

## 2013-10-15 MED ORDER — 0.9 % SODIUM CHLORIDE (POUR BTL) OPTIME
TOPICAL | Status: DC | PRN
Start: 2013-10-15 — End: 2013-10-15
  Administered 2013-10-15: 1000 mL

## 2013-10-15 MED ORDER — ARTIFICIAL TEARS OP OINT
TOPICAL_OINTMENT | OPHTHALMIC | Status: DC | PRN
  Administered 2013-10-15: 1 via OPHTHALMIC

## 2013-10-15 MED ORDER — MIDAZOLAM HCL 5 MG/5ML IJ SOLN
INTRAMUSCULAR | Status: DC | PRN
  Administered 2013-10-15: 1 mg via INTRAVENOUS

## 2013-10-15 MED ORDER — FENTANYL CITRATE 0.05 MG/ML IJ SOLN
INTRAMUSCULAR | Status: DC | PRN
Start: 2013-10-15 — End: 2013-10-15
  Administered 2013-10-15: 50 ug via INTRAVENOUS
  Administered 2013-10-15: 100 ug via INTRAVENOUS

## 2013-10-15 MED ORDER — LIDOCAINE HCL (CARDIAC) 20 MG/ML IV SOLN
INTRAVENOUS | Status: AC
  Filled 2013-10-15: qty 5

## 2013-10-15 MED ORDER — FENTANYL CITRATE 0.05 MG/ML IJ SOLN
INTRAMUSCULAR | Status: AC
  Filled 2013-10-15: qty 5

## 2013-10-15 MED ORDER — ROCURONIUM BROMIDE 50 MG/5ML IV SOLN
INTRAVENOUS | Status: AC
  Filled 2013-10-15: qty 1

## 2013-10-15 MED ORDER — DEXAMETHASONE SODIUM PHOSPHATE 10 MG/ML IJ SOLN
10.0000 mg | Freq: Once | INTRAMUSCULAR | Status: DC
  Filled 2013-10-15: qty 1

## 2013-10-15 MED ORDER — ONDANSETRON HCL 4 MG/2ML IJ SOLN: 4.0000 mg | Freq: Once | INTRAMUSCULAR | Status: DC | PRN

## 2013-10-15 MED ORDER — LIDOCAINE HCL (CARDIAC) 20 MG/ML IV SOLN
INTRAVENOUS | Status: DC | PRN
  Administered 2013-10-15: 50 mg via INTRAVENOUS

## 2013-10-15 MED ORDER — CLINDAMYCIN PHOSPHATE 600 MG/50ML IV SOLN
600.0000 mg | Freq: Once | INTRAVENOUS | Status: AC
  Administered 2013-10-15: 600 mg via INTRAVENOUS
  Filled 2013-10-15: qty 50

## 2013-10-15 MED ORDER — LIDOCAINE-EPINEPHRINE 1 %-1:100000 IJ SOLN
INTRAMUSCULAR | Status: AC
  Filled 2013-10-15: qty 1

## 2013-10-15 MED ORDER — GLYCOPYRROLATE 0.2 MG/ML IJ SOLN
INTRAMUSCULAR | Status: AC
  Filled 2013-10-15: qty 3

## 2013-10-15 MED ORDER — ROCURONIUM BROMIDE 100 MG/10ML IV SOLN
INTRAVENOUS | Status: DC | PRN
  Administered 2013-10-15: 25 mg via INTRAVENOUS

## 2013-10-15 SURGICAL SUPPLY — 37 items
BLADE 15 SAFETY STRL DISP (BLADE) ×2 IMPLANT
BLADE SURG 10 STRL SS (BLADE) ×2 IMPLANT
CANISTER SUCTION 2500CC (MISCELLANEOUS) ×2 IMPLANT
CLEANER TIP ELECTROSURG 2X2 (MISCELLANEOUS) ×2 IMPLANT
CORDS BIPOLAR (ELECTRODE) ×2 IMPLANT
COVER SURGICAL LIGHT HANDLE (MISCELLANEOUS) ×2 IMPLANT
DRAPE ORTHO SPLIT 77X108 STRL (DRAPES) ×2
DRAPE SURG ORHT 6 SPLT 77X108 (DRAPES) ×1 IMPLANT
ELECT COATED BLADE 2.86 ST (ELECTRODE) ×2 IMPLANT
ELECT REM PT RETURN 9FT ADLT (ELECTROSURGICAL) ×2
ELECTRODE REM PT RTRN 9FT ADLT (ELECTROSURGICAL) IMPLANT
GAUZE SPONGE 4X4 16PLY XRAY LF (GAUZE/BANDAGES/DRESSINGS) ×2 IMPLANT
GLOVE BIO SURGEON STRL SZ 6.5 (GLOVE) ×2 IMPLANT
GLOVE BIOGEL PI IND STRL 6.5 (GLOVE) IMPLANT
GLOVE BIOGEL PI INDICATOR 6.5 (GLOVE) ×2
GLOVE SURG SS PI 6.5 STRL IVOR (GLOVE) ×1 IMPLANT
GLOVE SURG SS PI 7.5 STRL IVOR (GLOVE) ×2 IMPLANT
GOWN STRL REUS W/ TWL LRG LVL3 (GOWN DISPOSABLE) ×1 IMPLANT
GOWN STRL REUS W/TWL LRG LVL3 (GOWN DISPOSABLE) ×2
KIT BASIN OR (CUSTOM PROCEDURE TRAY) ×2 IMPLANT
KIT ROOM TURNOVER OR (KITS) ×2 IMPLANT
NDL HYPO 25GX1X1/2 BEV (NEEDLE) IMPLANT
NEEDLE HYPO 25GX1X1/2 BEV (NEEDLE) ×2 IMPLANT
NS IRRIG 1000ML POUR BTL (IV SOLUTION) ×2 IMPLANT
PACK SURGICAL SETUP 50X90 (CUSTOM PROCEDURE TRAY) ×2 IMPLANT
PAD ARMBOARD 7.5X6 YLW CONV (MISCELLANEOUS) ×4 IMPLANT
PENCIL BUTTON HOLSTER BLD 10FT (ELECTRODE) ×2 IMPLANT
SUT CHROMIC 4 0 PS 2 18 (SUTURE) ×1 IMPLANT
SUT SILK 2 0 (SUTURE) ×2
SUT SILK 2 0 SH CR/8 (SUTURE) ×2 IMPLANT
SUT SILK 2-0 18XBRD TIE 12 (SUTURE) ×1 IMPLANT
SUT VIC AB 3-0 SH 18 (SUTURE) ×2 IMPLANT
SYR BULB 3OZ (MISCELLANEOUS) ×2 IMPLANT
SYRINGE 10CC LL (SYRINGE) ×2 IMPLANT
TOWEL OR 17X24 6PK STRL BLUE (TOWEL DISPOSABLE) ×2 IMPLANT
TUBE CONNECTING 12X1/4 (SUCTIONS) ×2 IMPLANT
YANKAUER SUCT BULB TIP NO VENT (SUCTIONS) ×2 IMPLANT

## 2013-10-15 NOTE — Discharge Instructions (Signed)
Regular diet as tolerated, up ad lib. Rx for hydrocodone on chart, Rx for antibiotic and Zofran sent to pharmacy. Follow up with Dr. Emeline DarlingGore as needed.

## 2013-10-15 NOTE — Anesthesia Postprocedure Evaluation (Signed)
  Anesthesia Post-op Note  Patient: Kelly Newton  Procedure(s) Performed: Procedure(s): EXCISION SUBMANDIBULAR STONE  (Right)  Patient Location: PACU  Anesthesia Type:General  Level of Consciousness: awake, oriented, sedated and patient cooperative  Airway and Oxygen Therapy: Patient Spontanous Breathing  Post-op Pain: mild  Post-op Assessment: Post-op Vital signs reviewed, Patient's Cardiovascular Status Stable, Respiratory Function Stable, Patent Airway, No signs of Nausea or vomiting and Pain level controlled  Post-op Vital Signs: stable  Last Vitals:  Filed Vitals:   10/15/13 0816  BP: 130/76  Pulse: 100  Temp:   Resp:     Complications: No apparent anesthesia complications

## 2013-10-15 NOTE — Op Note (Signed)
DATE OF OPERATION: 10/15/2013 Surgeon: Melvenia BeamMitchell Verlan Grotz Procedure Performed: 42330-Right, right intraoral sialolithotomy/removal of right submandibular duct stone  PREOPERATIVE DIAGNOSIS: right submandibular duct stone  POSTOPERATIVE DIAGNOSIS: right submandibular duct stone SURGEON: Melvenia BeamMitchell Lynsie Mcwatters ANESTHESIA: General endotracheal.  ESTIMATED BLOOD LOSS: minimal  DRAINS: none SPECIMENS: right submandibular duct stone INDICATIONS: The patient is a 47yo with a history of right submandibular sialolithiasis and sialoadenitis due to a right submandibular duct stone DESCRIPTION OF OPERATION: The patient was brought to the operating room and was placed in the supine position and was placed under general endotracheal anesthesia by anesthesiology. The bite block was placed to open the dentition and the tongue was retracted superiorly. The right Wharton's duct duct was identified and the right submandibular duct stone was palpated and identified just distal to the duct opening. I injected the area with 1% lidocaine with epinephrine and then used the 15 blade and bovie to incise the mucosa directly over the stone. I then used the Bovie and hemostat/short sharp scissors to dissect down onto the stone. I removed the stone using an Allis clamp and passed this off. The dilated submandibular duct was patent and there was normal egress of saliva anteriorly once the stone was removed. I used a 4-0 chromic gut horizontal mattress suture to close just the posterior aspect of the incision and left the anterior portion of the incision with the submandibular duct anteriorly widely patent. Hemostasis was noted. The patient was turned back to anesthesia and awakened from anesthesia and extubated without difficulty. The patient tolerated the procedure well with no immediate complications and was taken to the postoperative recovery area in good condition.   Dr. Melvenia BeamMitchell Tory Septer was present and performed the entire procedure. 10/15/2013   8:08 AM Melvenia BeamMitchell Jaeson Molstad

## 2013-10-15 NOTE — Anesthesia Preprocedure Evaluation (Addendum)
Anesthesia Evaluation  Patient identified by MRN, date of birth, ID band Patient awake    Reviewed: Allergy & Precautions, H&P , NPO status , Patient's Chart, lab work & pertinent test results  History of Anesthesia Complications Negative for: history of anesthetic complications  Airway Mallampati: II TM Distance: >3 FB Neck ROM: Full    Dental  (+) Teeth Intact   Pulmonary neg pulmonary ROS,  breath sounds clear to auscultation        Cardiovascular negative cardio ROS  Rhythm:Regular     Neuro/Psych negative neurological ROS  negative psych ROS   GI/Hepatic Neg liver ROS, GERD-  ,  Endo/Other  negative endocrine ROS  Renal/GU negative Renal ROS     Musculoskeletal negative musculoskeletal ROS (+)   Abdominal   Peds  Hematology negative hematology ROS (+)   Anesthesia Other Findings   Reproductive/Obstetrics                          Anesthesia Physical Anesthesia Plan  ASA: I  Anesthesia Plan: General   Post-op Pain Management:    Induction: Intravenous  Airway Management Planned: Oral ETT  Additional Equipment: None  Intra-op Plan:   Post-operative Plan: Extubation in OR  Informed Consent: I have reviewed the patients History and Physical, chart, labs and discussed the procedure including the risks, benefits and alternatives for the proposed anesthesia with the patient or authorized representative who has indicated his/her understanding and acceptance.   Dental advisory given  Plan Discussed with: CRNA and Surgeon  Anesthesia Plan Comments:        Anesthesia Quick Evaluation

## 2013-10-15 NOTE — Transfer of Care (Signed)
Immediate Anesthesia Transfer of Care Note  Patient: Kelly Newton  Procedure(s) Performed: Procedure(s): EXCISION SUBMANDIBULAR STONE  (Right)  Patient Location: PACU  Anesthesia Type:General  Level of Consciousness: awake, alert  and oriented  Airway & Oxygen Therapy: Patient Spontanous Breathing and Patient connected to face mask oxygen  Post-op Assessment: Report given to PACU RN and Post -op Vital signs reviewed and stable  Post vital signs: Reviewed and stable  Complications: No apparent anesthesia complications

## 2013-10-18 ENCOUNTER — Encounter (HOSPITAL_COMMUNITY): Payer: Self-pay | Admitting: Otolaryngology

## 2014-05-04 ENCOUNTER — Encounter (HOSPITAL_COMMUNITY): Payer: Self-pay | Admitting: Emergency Medicine

## 2014-05-04 ENCOUNTER — Emergency Department (HOSPITAL_COMMUNITY)
Admission: EM | Admit: 2014-05-04 | Discharge: 2014-05-05 | Disposition: A | Discharge status: Home / Self Care | Payer: Commercial Managed Care - PPO | Attending: Emergency Medicine | Admitting: Emergency Medicine

## 2014-05-04 DIAGNOSIS — Z7952 Long term (current) use of systemic steroids: Secondary | ICD-10-CM | POA: Insufficient documentation

## 2014-05-04 DIAGNOSIS — Z7982 Long term (current) use of aspirin: Secondary | ICD-10-CM | POA: Insufficient documentation

## 2014-05-04 DIAGNOSIS — Z792 Long term (current) use of antibiotics: Secondary | ICD-10-CM | POA: Insufficient documentation

## 2014-05-04 DIAGNOSIS — Z791 Long term (current) use of non-steroidal anti-inflammatories (NSAID): Secondary | ICD-10-CM | POA: Insufficient documentation

## 2014-05-04 DIAGNOSIS — G4489 Other headache syndrome: Secondary | ICD-10-CM | POA: Insufficient documentation

## 2014-05-04 DIAGNOSIS — R110 Nausea: Secondary | ICD-10-CM | POA: Insufficient documentation

## 2014-05-04 DIAGNOSIS — K219 Gastro-esophageal reflux disease without esophagitis: Secondary | ICD-10-CM | POA: Insufficient documentation

## 2014-05-04 MED ORDER — KETOROLAC TROMETHAMINE 30 MG/ML IJ SOLN
30.0000 mg | Freq: Once | INTRAMUSCULAR | Status: AC
  Administered 2014-05-04: 30 mg via INTRAVENOUS
  Filled 2014-05-04: qty 1

## 2014-05-04 MED ORDER — DIPHENHYDRAMINE HCL 50 MG/ML IJ SOLN
25.0000 mg | Freq: Once | INTRAMUSCULAR | Status: AC
  Administered 2014-05-04: 25 mg via INTRAVENOUS
  Filled 2014-05-04: qty 1

## 2014-05-04 MED ORDER — SODIUM CHLORIDE 0.9 % IV BOLUS (SEPSIS)
1000.0000 mL | Freq: Once | INTRAVENOUS | Status: AC
  Administered 2014-05-04: 1000 mL via INTRAVENOUS

## 2014-05-04 MED ORDER — METOCLOPRAMIDE HCL 5 MG/ML IJ SOLN
10.0000 mg | Freq: Once | INTRAMUSCULAR | Status: AC
  Administered 2014-05-04: 10 mg via INTRAVENOUS
  Filled 2014-05-04: qty 2

## 2014-05-04 NOTE — ED Notes (Signed)
Pt c/o HA x 2 days intermittent. Pt states she believes she was told she had HTN in the past but has not been prescribed medication. Intermittent blurred vision, no neuro deficits. A & O.

## 2014-05-04 NOTE — ED Provider Notes (Signed)
CSN: [id number]     Arrival date & time 05/04/14  2055 History   First MD Initiated Contact with Patient 05/04/14 2202     Chief Complaint  Patient presents with  . Headache     (Consider location/radiation/quality/duration/timing/severity/associated sxs/prior Treatment) HPI Comments: Patient is a 48 year old female with a past medical history of migraines and GERD who presents with a headache for 3 days. Patient reports a gradual onset and progressive worsening of the headache. The pain is sharp, constant and is located in generalized head without radiation. Patient has tried Goody powder for symptoms without relief. No alleviating/aggravating factors. Patient reports associated nausea and photophobia. Patient denies fever, vomiting, diarrhea, numbness/tingling, weakness, visual changes, congestion, chest pain, SOB, abdominal pain.     Patient is a 48 y.o. female presenting with headaches.  Headache Associated symptoms: nausea   Associated symptoms: no abdominal pain, no diarrhea, no dizziness, no fatigue, no fever, no neck pain and no vomiting     Past Medical History  Diagnosis Date  . GERD (gastroesophageal reflux disease)    Past Surgical History  Procedure Laterality Date  . Cesarean section  1996    /w epidural  . Submandibular gland excision Right 10/15/2013    Procedure: EXCISION SUBMANDIBULAR STONE ;  Surgeon: Melvenia BeamMitchell Gore, MD;  [facility];  Service: ENT;  Laterality: Right;   Family History  Problem Relation Age of Onset  . Diabetes Mother   . Hypertension Mother   . Diabetes Father   . Hypertension Father    History  Substance Use Topics  . Smoking status: Never Smoker   . Smokeless tobacco: Not on file  . Alcohol Use: No   OB History    No data available     Review of Systems  Constitutional: Negative for fever, chills and fatigue.  HENT: Negative for trouble swallowing.   Eyes: Negative for visual disturbance.  Respiratory: Negative for shortness  of breath.   Cardiovascular: Negative for chest pain and palpitations.  Gastrointestinal: Positive for nausea. Negative for vomiting, abdominal pain and diarrhea.  Genitourinary: Negative for dysuria and difficulty urinating.  Musculoskeletal: Negative for arthralgias and neck pain.  Skin: Negative for color change.  Neurological: Positive for headaches. Negative for dizziness and weakness.  Psychiatric/Behavioral: Negative for dysphoric mood.      Allergies  Review of patient's allergies indicates no known allergies.  Home Medications   Prior to Admission medications   Medication Sig Start Date End Date Taking? Authorizing Provider  Aspirin-Caffeine (BC FAST PAIN RELIEF) 845-65 MG PACK Take 1 each by mouth as needed (for pain).   Yes Historical Provider, MD  calcium carbonate (TUMS EX) 750 MG chewable tablet Chew 2 tablets by mouth daily.    Historical Provider, MD  doxycycline (VIBRA-TABS) 100 MG tablet Take 100 mg by mouth 2 (two) times daily. 7 days left on course    Historical Provider, MD  naproxen sodium (ANAPROX) 220 MG tablet Take 220 mg by mouth 2 (two) times daily with a meal.    Historical Provider, MD  predniSONE (DELTASONE) 10 MG tablet Take 10 mg by mouth once.    Historical Provider, MD   BP 158/102 mmHg  Pulse 92  Temp(Src) 98.7 F (37.1 C) (Oral)  Resp 16  Ht 5\' 1"  (1.549 m)  Wt 163 lb (73.936 kg)  BMI 30.81 kg/m2  SpO2 99% Physical Exam  Constitutional: She is oriented to person, place, and time. She appears well-developed and well-nourished. No distress.  HENT:  Head: Normocephalic and atraumatic.  Eyes: Conjunctivae and EOM are normal. Pupils are equal, round, and reactive to light.  Neck: Normal range of motion.  Cardiovascular: Normal rate and regular rhythm.  Exam reveals no gallop and no friction rub.   No murmur heard. Pulmonary/Chest: Effort normal and breath sounds normal. She has no wheezes. She has no rales. She exhibits no tenderness.   Abdominal: Soft. She exhibits no distension. There is no tenderness. There is no rebound.  Musculoskeletal: Normal range of motion.  Neurological: She is alert and oriented to person, place, and time. No cranial nerve deficit. Coordination normal.  Extremity strength and sensation equal and intact bilaterally. Speech is goal-oriented. Moves limbs without ataxia.   Skin: Skin is warm and dry.  Psychiatric: She has a normal mood and affect. Her behavior is normal.  Nursing note and vitals reviewed.   ED Course  Procedures (including critical care time) Labs Review Labs Reviewed  URINALYSIS, ROUTINE W REFLEX MICROSCOPIC - Abnormal; Notable for the following:    APPearance HAZY (*)    Hgb urine dipstick LARGE (*)    Leukocytes, UA SMALL (*)    All other components within normal limits  URINE MICROSCOPIC-ADD ON - Abnormal; Notable for the following:    Bacteria, UA FEW (*)    All other components within normal limits    Imaging Review No results found.   EKG Interpretation None      MDM   Final diagnoses:  Other headache syndrome    11:29 PM Patient will have migraines cocktail of fluids, toradol, reglan, and benadryl. Vitals stable and patient afebrile. No meningeal signs at this time.   1:02 AM Patient feeling better and will be discharged.    Emilia BeckKaitlyn Dillen Belmontes, PA-C 05/05/14 0106  Elwin MochaBlair Walden, MD 05/07/14 [phone]

## 2014-05-04 NOTE — ED Notes (Addendum)
Pt reports having a headache (behind eyes) x several days accompanied by nausea but no vomiting. She has been treating headache with BC Powders last dose was about 6 hours ago. Pt does not have a PCP and reports she was once told she had hypertension but has not followed up with that.

## 2014-05-05 LAB — URINALYSIS, ROUTINE W REFLEX MICROSCOPIC
Bilirubin Urine: NEGATIVE
GLUCOSE, UA: NEGATIVE mg/dL
Ketones, ur: NEGATIVE mg/dL
Nitrite: NEGATIVE
PROTEIN: NEGATIVE mg/dL
Specific Gravity, Urine: 1.013 (ref 1.005–1.030)
UROBILINOGEN UA: 0.2 mg/dL (ref 0.0–1.0)
pH: 6 (ref 5.0–8.0)

## 2014-05-05 LAB — URINE MICROSCOPIC-ADD ON

## 2014-05-05 NOTE — ED Notes (Signed)
PA at bedside.

## 2014-05-05 NOTE — ED Notes (Signed)
Pt alert, oriented, and ambulatory with steady gait upon DC. She reports that she is leaving with ALL belongings she arrived with. Children present upon her DC.

## 2014-05-05 NOTE — Discharge Instructions (Signed)
Refer to attached documents for more information. Return to the ED with worsening or concerning symptoms.  °

## 2014-11-17 ENCOUNTER — Other Ambulatory Visit: Payer: Self-pay | Admitting: Obstetrics & Gynecology

## 2014-11-18 LAB — CYTOLOGY - PAP

## 2015-07-18 ENCOUNTER — Encounter (HOSPITAL_COMMUNITY): Payer: Self-pay | Admitting: Emergency Medicine

## 2015-07-18 ENCOUNTER — Emergency Department (INDEPENDENT_AMBULATORY_CARE_PROVIDER_SITE_OTHER)
Admission: EM | Admit: 2015-07-18 | Discharge: 2015-07-18 | Disposition: A | Discharge status: Home / Self Care | Payer: Commercial Managed Care - HMO | Source: Home / Self Care | Attending: Emergency Medicine | Admitting: Emergency Medicine

## 2015-07-18 ENCOUNTER — Emergency Department (INDEPENDENT_AMBULATORY_CARE_PROVIDER_SITE_OTHER): Payer: Commercial Managed Care - HMO

## 2015-07-18 DIAGNOSIS — S99921A Unspecified injury of right foot, initial encounter: Secondary | ICD-10-CM | POA: Diagnosis not present

## 2015-07-18 NOTE — ED Provider Notes (Signed)
CSN: [id number]     Arrival date & time 07/18/15  1831 History   First MD Initiated Contact with Patient 07/18/15 1856     Chief Complaint  Patient presents with  . Foot Injury   (Consider location/radiation/quality/duration/timing/severity/associated sxs/prior Treatment) HPI Injury to right foot Saturday while cleaning. Thinks she may have stepped on some object. Saturday/sunday pain a bit better with rest. Return of pain Monday and today with working.  Symptomatic treatment at home.  Past Medical History  Diagnosis Date  . GERD (gastroesophageal reflux disease)    Past Surgical History  Procedure Laterality Date  . Cesarean section  1996    /w epidural  . Submandibular gland excision Right 10/15/2013    Procedure: EXCISION SUBMANDIBULAR STONE ;  Surgeon: Melvenia Beam, MD;  Location: [facility] OR;  Service: ENT;  Laterality: Right;   Family History  Problem Relation Age of Onset  . Diabetes Mother   . Hypertension Mother   . Diabetes Father   . Hypertension Father    Social History  Substance Use Topics  . Smoking status: Never Smoker   . Smokeless tobacco: None  . Alcohol Use: No   OB History    No data available     Review of Systems ROS +'ve right foot injury  Denies: HEADACHE, NAUSEA, ABDOMINAL PAIN, CHEST PAIN, CONGESTION, DYSURIA, SHORTNESS OF BREATH  Allergies  Review of patient's allergies indicates no known allergies.  Home Medications   Prior to Admission medications   Medication Sig Start Date End Date Taking? Authorizing Provider  Aspirin-Caffeine (BC FAST PAIN RELIEF) 845-65 MG PACK Take 1 each by mouth as needed (for pain).    Historical Provider, MD  calcium carbonate (TUMS EX) 750 MG chewable tablet Chew 2 tablets by mouth daily.    Historical Provider, MD  doxycycline (VIBRA-TABS) 100 MG tablet Take 100 mg by mouth 2 (two) times daily. 7 days left on course    Historical Provider, MD  naproxen sodium (ANAPROX) 220 MG tablet Take 220 mg by mouth 2 (two)  times daily with a meal.    Historical Provider, MD  predniSONE (DELTASONE) 10 MG tablet Take 10 mg by mouth once.    Historical Provider, MD   Meds Ordered and Administered this Visit  Medications - No data to display  BP 135/93 mmHg  Pulse 92  Temp(Src) 98.4 F (36.9 C) (Oral)  SpO2 99%  LMP 07/12/2015 No data found.   Physical Exam  Constitutional: She appears well-nourished.  Musculoskeletal: Normal range of motion. She exhibits tenderness.       Right foot: There is tenderness.       Feet:  Nursing note and vitals reviewed.   ED Course  Procedures (including critical care time)  Labs Review Labs Reviewed - No data to display  Imaging Review No results found.   Visual Acuity Review  Right Eye Distance:   Left Eye Distance:   Bilateral Distance:    Right Eye Near:   Left Eye Near:    Bilateral Near:        Review of right foot x-ray: negative MDM   1. Foot injury, right, initial encounter     symptomatic foot care Wear cam walker for support and to help with ambulation    Tharon Aquas, [place] 07/18/15 1927

## 2015-07-18 NOTE — ED Notes (Signed)
Pt here with right foot swelling radiating to lateral ankle after possible roll Saturday States she stepped on something while cleaning, then ankle twisted Hot water and Aspirin used

## 2015-07-18 NOTE — Discharge Instructions (Signed)
The bones of your foot are fine. No broken bones  You are being placed in a walking boot that should help make getting around easier and more comfortable  Keep foot elevated, ibuprofen for pain 400-600 mg every 6 hours.   Apply moist heat to your foot.  You may return to work thursday

## 2015-07-19 NOTE — ED Notes (Signed)
Spoke to dr Piedad Climes about patient request.  Dr Piedad Climes agreed for patient to go back to work on Friday.  Provided note as directed by dr Piedad Climes

## 2015-07-19 NOTE — ED Notes (Signed)
accessed record for patient phone call and extension of work note

## 2016-04-26 ENCOUNTER — Ambulatory Visit (HOSPITAL_COMMUNITY)
Admission: EM | Admit: 2016-04-26 | Discharge: 2016-04-26 | Disposition: A | Discharge status: Home / Self Care | Payer: BLUE CROSS/BLUE SHIELD | Attending: Internal Medicine | Admitting: Internal Medicine

## 2016-04-26 ENCOUNTER — Encounter (HOSPITAL_COMMUNITY): Payer: Self-pay | Admitting: Family Medicine

## 2016-04-26 DIAGNOSIS — I10 Essential (primary) hypertension: Secondary | ICD-10-CM

## 2016-04-26 DIAGNOSIS — Z760 Encounter for issue of repeat prescription: Secondary | ICD-10-CM

## 2016-04-26 MED ORDER — HYDROCHLOROTHIAZIDE 12.5 MG PO TABS: 12.5000 mg | ORAL_TABLET | Freq: Every day | ORAL | 1 refills | Status: DC

## 2016-04-26 NOTE — ED Provider Notes (Signed)
CSN: [id number]     Arrival date & time 04/26/16  1103 History   First MD Initiated Contact with Patient 04/26/16 1215     Chief Complaint  Patient presents with  . Medication Refill   (Consider location/radiation/quality/duration/timing/severity/associated sxs/prior Treatment) HPI NP 50 Y/O FEMALE FOR MEDICATION REFILL OF HCTZ. HER DOCTOR HAS CLOSED DOORS. LAST PILL TODAY. NO PAIN OR OTHER SYMPTOMS.  Past Medical History:  Diagnosis Date  . GERD (gastroesophageal reflux disease)    Past Surgical History:  Procedure Laterality Date  . CESAREAN SECTION  1996   /w epidural  . SUBMANDIBULAR GLAND EXCISION Right 10/15/2013   Procedure: EXCISION SUBMANDIBULAR STONE ;  Surgeon: Melvenia BeamMitchell Gore, MD;  [facility];  Service: ENT;  Laterality: Right;   Family History  Problem Relation Age of Onset  . Diabetes Mother   . Hypertension Mother   . Diabetes Father   . Hypertension Father    Social History  Substance Use Topics  . Smoking status: Never Smoker  . Smokeless tobacco: Never Used  . Alcohol use No   OB History    No data available     Review of Systems  Denies: HEADACHE, NAUSEA, ABDOMINAL PAIN, CHEST PAIN, CONGESTION, DYSURIA, SHORTNESS OF BREATH  Allergies  Review of patient's allergies indicates no known allergies.  Home Medications   Prior to Admission medications   Medication Sig Start Date End Date Taking? Authorizing Provider  hydrochlorothiazide (HYDRODIURIL) 12.5 MG tablet Take 1 tablet (12.5 mg total) by mouth daily. 04/26/16   Tharon AquasFrank C Patrick, PA   Meds Ordered and Administered this Visit  Medications - No data to display  BP 138/92   Pulse 82   Temp 98.1 F (36.7 C)   Resp 18   SpO2 98%  No data found.   Physical Exam NURSES NOTES AND VITAL SIGNS REVIEWED. CONSTITUTIONAL: Well developed, well nourished, no acute distress HEENT: normocephalic, atraumatic EYES: Conjunctiva normal NECK:normal ROM, supple, no adenopathy PULMONARY:No  respiratory distress, normal effort ABDOMINAL: Soft, ND, NT BS+, No CVAT MUSCULOSKELETAL: Normal ROM of all extremities,  SKIN: warm and dry without rash PSYCHIATRIC: Mood and affect, behavior are normal  Urgent Care Course   Clinical Course    Procedures (including critical care time)  Labs Review Labs Reviewed - No data to display  Imaging Review No results found.   Visual Acuity Review  Right Eye Distance:   Left Eye Distance:   Bilateral Distance:    Right Eye Near:   Left Eye Near:    Bilateral Near:         MDM   1. Medication refill   HYPERTENSION  Patient is reassured that there are no issues that require transfer to higher level of care at this time or additional tests. Patient is advised to continue home symptomatic treatment. Patient is advised that if there are new or worsening symptoms to attend the emergency department, contact primary care provider, or return to UC. Instructions of care provided discharged home in stable condition.    THIS NOTE WAS GENERATED USING A VOICE RECOGNITION SOFTWARE PROGRAM. ALL REASONABLE EFFORTS  WERE MADE TO PROOFREAD THIS DOCUMENT FOR ACCURACY.  I have verbally reviewed the discharge instructions with the patient. A printed AVS was given to the patient.  All questions were answered prior to discharge.      Tharon AquasFrank C Patrick, PA 04/26/16 [phone]

## 2016-04-26 NOTE — ED Triage Notes (Signed)
Pt here for med refill. sts her clinic closed down. Pt needs HCTZ 12.5 mg.

## 2016-08-06 ENCOUNTER — Encounter (HOSPITAL_COMMUNITY): Payer: Self-pay | Admitting: Emergency Medicine

## 2016-08-06 ENCOUNTER — Ambulatory Visit (HOSPITAL_COMMUNITY)
Admission: EM | Admit: 2016-08-06 | Discharge: 2016-08-06 | Disposition: A | Discharge status: Home / Self Care | Payer: BLUE CROSS/BLUE SHIELD | Attending: Family Medicine | Admitting: Family Medicine

## 2016-08-06 DIAGNOSIS — M542 Cervicalgia: Secondary | ICD-10-CM

## 2016-08-06 MED ORDER — CYCLOBENZAPRINE HCL 5 MG PO TABS: 5.0000 mg | ORAL_TABLET | Freq: Every day | ORAL | 0 refills | Status: DC

## 2016-08-06 MED ORDER — NAPROXEN 500 MG PO TABS: 500.0000 mg | ORAL_TABLET | Freq: Two times a day (BID) | ORAL | 0 refills | Status: DC

## 2016-08-06 NOTE — ED Provider Notes (Signed)
CSN: [id number]     Arrival date & time 08/06/16  1930 History   First MD Initiated Contact with Patient 08/06/16 2009     Chief Complaint  Patient presents with  . Otalgia   (Consider location/radiation/quality/duration/timing/severity/associated sxs/prior Treatment) Patient c/o right neck and ear pain for a week.   The history is provided by the patient.  Otalgia  Location:  Right Behind ear:  No abnormality Severity:  No pain Onset quality:  Sudden Duration:  1 week Timing:  Constant Progression:  Worsening Chronicity:  New Relieved by:  None tried Ineffective treatments:  None tried   Past Medical History:  Diagnosis Date  . GERD (gastroesophageal reflux disease)    Past Surgical History:  Procedure Laterality Date  . CESAREAN SECTION  1996   /w epidural  . SUBMANDIBULAR GLAND EXCISION Right 10/15/2013   Procedure: EXCISION SUBMANDIBULAR STONE ;  Surgeon: Melvenia Beam, MD;  Location: [facility] OR;  Service: ENT;  Laterality: Right;   Family History  Problem Relation Age of Onset  . Diabetes Mother   . Hypertension Mother   . Diabetes Father   . Hypertension Father    Social History  Substance Use Topics  . Smoking status: Never Smoker  . Smokeless tobacco: Never Used  . Alcohol use No   OB History    No data available     Review of Systems  Constitutional: Negative.   HENT: Positive for ear pain.   Eyes: Negative.   Cardiovascular: Negative.   Gastrointestinal: Negative.   Endocrine: Negative.   Genitourinary: Negative.   Musculoskeletal: Positive for myalgias.  Skin: Negative.   Allergic/Immunologic: Negative.   Neurological: Negative.   Hematological: Negative.   Psychiatric/Behavioral: Negative.     Allergies  Patient has no known allergies.  Home Medications   Prior to Admission medications   Medication Sig Start Date End Date Taking? Authorizing Provider  hydrochlorothiazide (HYDRODIURIL) 12.5 MG tablet Take 1 tablet (12.5 mg total) by  mouth daily. 04/26/16  Yes Tharon Aquas, PA  ibuprofen (ADVIL,MOTRIN) 600 MG tablet Take 600 mg by mouth every 6 (six) hours as needed.   Yes Historical Provider, MD  cyclobenzaprine (FLEXERIL) 5 MG tablet Take 1 tablet (5 mg total) by mouth at bedtime. 08/06/16   Deatra Canter, FNP  naproxen (NAPROSYN) 500 MG tablet Take 1 tablet (500 mg total) by mouth 2 (two) times daily with a meal. 08/06/16   Deatra Canter, FNP   Meds Ordered and Administered this Visit  Medications - No data to display  BP 135/87 (BP Location: Right Arm)   Pulse 80   Temp 98.7 F (37.1 C) (Oral)   Resp 18   SpO2 98%  No data found.   Physical Exam  Constitutional: She appears well-developed and well-nourished.  HENT:  Head: Normocephalic and atraumatic.  Right Ear: External ear normal.  Left Ear: External ear normal.  Mouth/Throat: Oropharynx is clear and moist.  Eyes: Conjunctivae and EOM are normal. Pupils are equal, round, and reactive to light.  Neck:  Tenderness right sternocleidomastoid muscle   Musculoskeletal: She exhibits tenderness.  Tenderness right sternocleidomastoid muscle.  Nursing note and vitals reviewed.   Urgent Care Course     Procedures (including critical care time)  Labs Review Labs Reviewed - No data to display  Imaging Review No results found.   Visual Acuity Review  Right Eye Distance:   Left Eye Distance:   Bilateral Distance:    Right Eye Near:  Left Eye Near:    Bilateral Near:         MDM   1. Cervicalgia    Flexeril 5mg  one po qhs #10 Naprosyn 500mg  one po bid x 7 days #14      Deatra CanterWilliam J Oxford, FNP 08/06/16 2018

## 2016-08-06 NOTE — ED Triage Notes (Signed)
[facility] a [facility] right ear pain x 1 week.

## 2016-09-10 ENCOUNTER — Encounter (HOSPITAL_COMMUNITY): Payer: Self-pay | Admitting: Family Medicine

## 2016-09-10 ENCOUNTER — Ambulatory Visit (HOSPITAL_COMMUNITY)
Admission: EM | Admit: 2016-09-10 | Discharge: 2016-09-10 | Disposition: A | Discharge status: Home / Self Care | Payer: BLUE CROSS/BLUE SHIELD | Attending: Internal Medicine | Admitting: Internal Medicine

## 2016-09-10 DIAGNOSIS — I10 Essential (primary) hypertension: Secondary | ICD-10-CM | POA: Insufficient documentation

## 2016-09-10 DIAGNOSIS — R300 Dysuria: Secondary | ICD-10-CM | POA: Diagnosis not present

## 2016-09-10 DIAGNOSIS — R319 Hematuria, unspecified: Secondary | ICD-10-CM | POA: Diagnosis present

## 2016-09-10 DIAGNOSIS — K219 Gastro-esophageal reflux disease without esophagitis: Secondary | ICD-10-CM | POA: Diagnosis not present

## 2016-09-10 DIAGNOSIS — N3001 Acute cystitis with hematuria: Secondary | ICD-10-CM | POA: Insufficient documentation

## 2016-09-10 HISTORY — DX: Essential (primary) hypertension: I10

## 2016-09-10 LAB — POCT URINALYSIS DIP (DEVICE)
Bilirubin Urine: NEGATIVE
GLUCOSE, UA: NEGATIVE mg/dL
KETONES UR: NEGATIVE mg/dL
Nitrite: NEGATIVE
PROTEIN: NEGATIVE mg/dL
Specific Gravity, Urine: 1.015 (ref 1.005–1.030)
Urobilinogen, UA: 0.2 mg/dL (ref 0.0–1.0)
pH: 6.5 (ref 5.0–8.0)

## 2016-09-10 MED ORDER — PHENAZOPYRIDINE HCL 200 MG PO TABS: 200.0000 mg | ORAL_TABLET | Freq: Three times a day (TID) | ORAL | 0 refills | Status: DC | PRN

## 2016-09-10 MED ORDER — CEPHALEXIN 500 MG PO CAPS: 500.0000 mg | ORAL_CAPSULE | Freq: Four times a day (QID) | ORAL | 0 refills | Status: DC

## 2016-09-10 NOTE — Discharge Instructions (Signed)
You are being treated today for a urinary tract infection. I have prescribed Keflex, take 1 tablet 4 times a day for 5 days. I have also prescribed Pyridium. Take 1 tablet a day 3 times a day for 2 days. Your urine will be sent for culture and you will be notified should any change in therapy be needed. Drink plenty of fluids and rest. Should your symptoms fail to resolve, follow up with your primary care provider or return to clinic.  °

## 2016-09-10 NOTE — ED Triage Notes (Signed)
Pt here with dysuria and hematuria. sts 2 weeks.

## 2016-09-10 NOTE — ED Provider Notes (Signed)
CSN: [id number]     Arrival date & time 09/10/16  1812 History   First MD Initiated Contact with Patient 09/10/16 1942     Chief Complaint  Patient presents with  . Dysuria  . Hematuria   (Consider location/radiation/quality/duration/timing/severity/associated sxs/prior Treatment) 51 year old female presents to clinic with 2 week history of dysuria, frequency, urgency, and hematuria. She has had a prior history of UTIs, states she has had a workup by urology in the past. And that her current symptoms are consistent with prior infections. She denies any nausea, vomiting, diarrhea, abdominal pain pelvic pain, she denies any vaginal discharge, she denies any vaginal lesions, she denies any spotting, a no weakness no shortness of breath no swelling in her hands, feet, or ankles.   The history is provided by the patient.  Dysuria  Hematuria     Past Medical History:  Diagnosis Date  . GERD (gastroesophageal reflux disease)   . Hypertension    Past Surgical History:  Procedure Laterality Date  . CESAREAN SECTION  1996   /w epidural  . SUBMANDIBULAR GLAND EXCISION Right 10/15/2013   Procedure: EXCISION SUBMANDIBULAR STONE ;  Surgeon: Melvenia Beam, MD;  Location: [facility] OR;  Service: ENT;  Laterality: Right;   Family History  Problem Relation Age of Onset  . Diabetes Mother   . Hypertension Mother   . Diabetes Father   . Hypertension Father    Social History  Substance Use Topics  . Smoking status: Never Smoker  . Smokeless tobacco: Never Used  . Alcohol use No   OB History    No data available     Review of Systems  Reason unable to perform ROS: As covered in history of present illness.  Genitourinary: Positive for dysuria and hematuria.  All other systems reviewed and are negative.   Allergies  Patient has no known allergies.  Home Medications   Prior to Admission medications   Medication Sig Start Date End Date Taking? Authorizing Provider  cephALEXin (KEFLEX) 500  MG capsule Take 1 capsule (500 mg total) by mouth 4 (four) times daily. 09/10/16   Dorena Bodo, NP  hydrochlorothiazide (HYDRODIURIL) 12.5 MG tablet Take 1 tablet (12.5 mg total) by mouth daily. 04/26/16   Tharon Aquas, PA  phenazopyridine (PYRIDIUM) 200 MG tablet Take 1 tablet (200 mg total) by mouth 3 (three) times daily as needed for pain. 09/10/16   Dorena Bodo, NP   Meds Ordered and Administered this Visit  Medications - No data to display  BP 131/84   Pulse 84   Temp 98.4 F (36.9 C)   Resp 16   SpO2 100%  No data found.   Physical Exam  Constitutional: She is oriented to person, place, and time. She appears well-developed and well-nourished. No distress.  HENT:  Head: Normocephalic and atraumatic.  Cardiovascular: Normal rate and regular rhythm.   Pulmonary/Chest: Effort normal and breath sounds normal.  Abdominal: Soft. Bowel sounds are normal. She exhibits no distension and no mass. There is no tenderness. There is no guarding and no CVA tenderness.  Neurological: She is alert and oriented to person, place, and time.  Skin: Skin is warm and dry. Capillary refill takes less than 2 seconds. She is not diaphoretic.  Psychiatric: She has a normal mood and affect. Her behavior is normal.  Nursing note and vitals reviewed.   Urgent Care Course     Procedures (including critical care time)  Labs Review Labs Reviewed  POCT URINALYSIS DIP (DEVICE) -  Abnormal; Notable for the following:       Result Value   Hgb urine dipstick MODERATE (*)    Leukocytes, UA TRACE (*)    All other components within normal limits  URINE CULTURE    Imaging Review No results found.     MDM   1. Acute cystitis with hematuria    You are being treated today for a urinary tract infection. I have prescribed Keflex, take 1 tablet 4 times a day for 5 days. I have also prescribed Pyridium. Take 1 tablet a day 3 times a day for 2 days. Your urine will be sent for culture and you will  be notified should any change in therapy be needed. Drink plenty of fluids and rest. Should your symptoms fail to resolve, follow up with your primary care provider or return to clinic.      Dorena BodoLawrence Alanii Ramer, NP 09/10/16 1955

## 2016-09-12 LAB — URINE CULTURE

## 2017-01-21 ENCOUNTER — Ambulatory Visit: Payer: BLUE CROSS/BLUE SHIELD | Admitting: Obstetrics

## 2018-12-05 ENCOUNTER — Ambulatory Visit (HOSPITAL_COMMUNITY)
Admission: EM | Admit: 2018-12-05 | Discharge: 2018-12-05 | Disposition: A | Discharge status: Home / Self Care | Payer: 59 | Attending: Family Medicine | Admitting: Family Medicine

## 2018-12-05 ENCOUNTER — Encounter (HOSPITAL_COMMUNITY): Payer: Self-pay | Admitting: Emergency Medicine

## 2018-12-05 ENCOUNTER — Other Ambulatory Visit: Payer: Self-pay

## 2018-12-05 DIAGNOSIS — I10 Essential (primary) hypertension: Secondary | ICD-10-CM

## 2018-12-05 MED ORDER — HYDROCHLOROTHIAZIDE 12.5 MG PO TABS: 12.5000 mg | ORAL_TABLET | Freq: Every day | ORAL | 0 refills | Status: DC

## 2018-12-05 NOTE — ED Triage Notes (Signed)
Here for medication refill for bp

## 2018-12-09 NOTE — ED Provider Notes (Signed)
[place]   [id number] 12/05/18 Arrival Time: 1059  ASSESSMENT & PLAN:  1. Essential hypertension     Meds ordered this encounter  Medications  . hydrochlorothiazide (HYDRODIURIL) 12.5 MG tablet    Sig: Take 1 tablet (12.5 mg total) by mouth daily.    Dispense:  90 tablet    Refill:  0    Follow-up Information    Schedule an appointment as soon as possible for a visit  with Primary Care at [facility].   Specialty:  Family Medicine Contact information: [address], [place] [phone]          Reviewed expectations re: course of current medical issues. Questions answered. Outlined signs and symptoms indicating need for more acute intervention. Patient verbalized understanding. After Visit Summary given.   SUBJECTIVE:  Kelly Newton is a 53 y.o. female who presents with concerns regarding increased blood pressures. @[facility] @ reports that she has been treated for hypertension in the past.  She reports taking medications as instructed, no medication side effects noted, no TIA's, no chest pain on exertion, no dyspnea on exertion, no swelling of ankles, no orthopnea or paroxysmal nocturnal dyspnea and no palpitations.  Denies symptoms of chest pain, palpations, orthopnea, nocturnal dyspnea, or LE edema.  Social History   Tobacco Use  Smoking Status Never Smoker  Smokeless Tobacco Never Used     ROS: As per HPI.   OBJECTIVE:  Vitals:   12/05/18 1123  BP: (!) 149/83  Pulse: 67  Resp: 16  Temp: 98.5 F (36.9 C)  TempSrc: Oral  SpO2: 100%    General appearance: alert; no distress Eyes: PERRLA; EOMI HENT: normocephalic; atraumatic Neck: supple Lungs: clear to auscultation bilaterally Heart: regular rate and rhythm without murmer Abdomen: soft, non-tender; bowel sounds normal Extremities: no edema; symmetrical with no gross deformities Skin: warm and dry Psychological: alert and cooperative;  normal mood and affect   Imaging: No results found.  No Known Allergies  Past Medical History:  Diagnosis Date  . GERD (gastroesophageal reflux disease)   . Hypertension    Social History   Socioeconomic History  . Marital status: Divorced    Spouse name: Not on file  . Number of children: Not on file  . Years of education: Not on file  . Highest education level: Not on file  Occupational History  . Not on file  Social Needs  . Financial resource strain: Not on file  . Food insecurity:    Worry: Not on file    Inability: Not on file  . Transportation needs:    Medical: Not on file    Non-medical: Not on file  Tobacco Use  . Smoking status: Never Smoker  . Smokeless tobacco: Never Used  Substance and Sexual Activity  . Alcohol use: No  . Drug use: No  . Sexual activity: Yes    Birth control/protection: Surgical  Lifestyle  . Physical activity:    Days per week: Not on file    Minutes per session: Not on file  . Stress: Not on file  Relationships  . Social connections:    Talks on phone: Not on file    Gets together: Not on file    Attends religious service: Not on file    Active member of club or organization: Not on file    Attends meetings of clubs or organizations: Not on file    Relationship status: Not on file  . Intimate partner violence:  Fear of current or ex partner: Not on file    Emotionally abused: Not on file    Physically abused: Not on file    Forced sexual activity: Not on file  Other Topics Concern  . Not on file  Social History Narrative  . Not on file   Family History  Problem Relation Age of Onset  . Diabetes Mother   . Hypertension Mother   . Diabetes Father   . Hypertension Father    Past Surgical History:  Procedure Laterality Date  . CESAREAN SECTION  1996   /w epidural  . SUBMANDIBULAR GLAND EXCISION Right 10/15/2013   Procedure: EXCISION SUBMANDIBULAR STONE ;  Surgeon: Melvenia BeamMitchell Gore, MD;  [facility];  Service:  ENT;  Laterality: Right;      Mardella LaymanHagler, Robie Oats, MD 12/09/18 [phone]

## 2019-09-24 ENCOUNTER — Ambulatory Visit (HOSPITAL_COMMUNITY)
Admission: EM | Admit: 2019-09-24 | Discharge: 2019-09-24 | Disposition: A | Discharge status: Home / Self Care | Payer: BC Managed Care – PPO | Attending: Urgent Care | Admitting: Urgent Care

## 2019-09-24 ENCOUNTER — Other Ambulatory Visit: Payer: Self-pay

## 2019-09-24 ENCOUNTER — Encounter (HOSPITAL_COMMUNITY): Payer: Self-pay

## 2019-09-24 DIAGNOSIS — Z760 Encounter for issue of repeat prescription: Secondary | ICD-10-CM

## 2019-09-24 DIAGNOSIS — I10 Essential (primary) hypertension: Secondary | ICD-10-CM

## 2019-09-24 DIAGNOSIS — R030 Elevated blood-pressure reading, without diagnosis of hypertension: Secondary | ICD-10-CM

## 2019-09-24 DIAGNOSIS — R519 Headache, unspecified: Secondary | ICD-10-CM

## 2019-09-24 MED ORDER — HYDROCHLOROTHIAZIDE 12.5 MG PO TABS: 12.5000 mg | ORAL_TABLET | Freq: Every day | ORAL | 0 refills | Status: DC

## 2019-09-24 NOTE — ED Provider Notes (Signed)
Kelly Newton   MRN: [id number] DOB: 25-Nov-1965  Subjective:   Kelly Newton is a 54 y.o. female presenting for medication refill.  Patient has a history of hypertension, has not been able to establish with a primary care doctor.  She takes HCTZ at 12.5 mg once daily.  Patient states that she has been out of her blood pressure medication for a few months now.  Has been having intermittent headaches worse in the past month.  Admits unhealthy diet.  Complete ROS as below.  No current facility-administered medications for this encounter.  Current Outpatient Medications:  .  hydrochlorothiazide (HYDRODIURIL) 12.5 MG tablet, Take 1 tablet (12.5 mg total) by mouth daily., Disp: 90 tablet, Rfl: 0   No Known Allergies  Past Medical History:  Diagnosis Date  . GERD (gastroesophageal reflux disease)   . Hypertension      Past Surgical History:  Procedure Laterality Date  . CESAREAN SECTION  1996   /w epidural  . SUBMANDIBULAR GLAND EXCISION Right 10/15/2013   Procedure: EXCISION SUBMANDIBULAR STONE ;  Surgeon: Ruby Cola, MD;  Location: [facility] OR;  Service: ENT;  Laterality: Right;    Family History  Problem Relation Age of Onset  . Diabetes Mother   . Hypertension Mother   . Diabetes Father   . Hypertension Father     Social History   Tobacco Use  . Smoking status: Never Smoker  . Smokeless tobacco: Never Used  Substance Use Topics  . Alcohol use: No  . Drug use: No    Review of Systems  Constitutional: Negative for fever and malaise/fatigue.  HENT: Negative for congestion, ear pain, sinus pain and sore throat.   Eyes: Negative for blurred vision, double vision, discharge and redness.  Respiratory: Negative for cough, hemoptysis, shortness of breath and wheezing.   Cardiovascular: Negative for chest pain.  Gastrointestinal: Negative for abdominal pain, diarrhea, nausea and vomiting.  Genitourinary: Negative for dysuria, flank pain and hematuria.   Musculoskeletal: Negative for myalgias.  Skin: Negative for rash.  Neurological: Positive for headaches. Negative for dizziness and weakness.  Psychiatric/Behavioral: Negative for depression and substance abuse.     Objective:   Vitals: BP (!) 142/77 (BP Location: Right Arm)   Pulse 89   Temp 98.1 F (36.7 C) (Oral)   Resp 18   SpO2 100%   Physical Exam Constitutional:      General: She is not in acute distress.    Appearance: Normal appearance. She is well-developed. She is not ill-appearing, toxic-appearing or diaphoretic.  HENT:     Head: Normocephalic and atraumatic.     Nose: Nose normal.     Mouth/Throat:     Mouth: Mucous membranes are moist.  Eyes:     Extraocular Movements: Extraocular movements intact.     Pupils: Pupils are equal, round, and reactive to light.  Cardiovascular:     Rate and Rhythm: Normal rate and regular rhythm.     Pulses: Normal pulses.     Heart sounds: Normal heart sounds. No murmur. No friction rub. No gallop.   Pulmonary:     Effort: Pulmonary effort is normal. No respiratory distress.     Breath sounds: Normal breath sounds. No stridor. No wheezing, rhonchi or rales.  Skin:    General: Skin is warm and dry.     Findings: No rash.  Neurological:     Mental Status: She is alert and oriented to person, place, and time.     Cranial Nerves: No  cranial nerve deficit.     Motor: No weakness.     Coordination: Coordination normal.     Gait: Gait normal.     Deep Tendon Reflexes: Reflexes normal.  Psychiatric:        Mood and Affect: Mood normal.        Behavior: Behavior normal.        Thought Content: Thought content normal.        Judgment: Judgment normal.     Assessment and Plan :   1. Essential hypertension   2. Elevated blood pressure reading   3. Encounter for medication refill   4. Generalized headaches     Refilled HCTZ, recommended dietary modifications, lifestyle modifications. Emphasized need to establish care with  a new PCP, info provided to the patient. No signs of ACS, stroke, acute process related to uncontrolled HTN. PE findings reassuring. Counseled patient on potential for adverse effects with medications prescribed/recommended today, ER and return-to-clinic precautions discussed, patient verbalized understanding.    Wallis Bamberg, [place] 09/25/19 [phone]

## 2019-09-24 NOTE — Discharge Instructions (Signed)
For elevated blood pressure, make sure you are monitoring salt in your diet.  Do not eat restaurant foods and limit processed foods at home, prepare/cook your own foods at home.  Processed foods include things like frozen meals preseasoned meats and dinners, deli meats, canned foods as they are high in sodium/salt.  Make sure your pain attention to sodium labels on foods you by at the grocery store.  For seasoning you can use a brand called Mrs. Dash which includes a lot of salt free seasonings.  Salads - kale, spinach, cabbage, spring mix; use seeds like pumpkin seeds or sunflower seeds, almonds; you can also use 1-2 hard boiled eggs in your salads Fruits - avocadoes, berries (blueberries, raspberries, blackberries), apples, oranges Vegetables - aspargus, cauliflower, broccoli, green beans, brussel spouts, bell peppers; stay away from starchy vegetables like potatoes, carrots, peas  Regarding meat it is better to eat lean meats and limit your red meat consumption including pork.  Wild caught fish, chicken breast are good options.  Do not eat any foods on this list that you are allergic to.  

## 2019-09-24 NOTE — ED Triage Notes (Signed)
Pt presents for medication refill on blood pressure medication. 

## 2019-10-29 ENCOUNTER — Encounter (HOSPITAL_COMMUNITY): Payer: Self-pay

## 2019-10-29 ENCOUNTER — Other Ambulatory Visit: Payer: Self-pay

## 2019-10-29 ENCOUNTER — Ambulatory Visit (HOSPITAL_COMMUNITY)
Admission: EM | Admit: 2019-10-29 | Discharge: 2019-10-29 | Disposition: A | Discharge status: Home / Self Care | Payer: BC Managed Care – PPO | Attending: Internal Medicine | Admitting: Internal Medicine

## 2019-10-29 DIAGNOSIS — M549 Dorsalgia, unspecified: Secondary | ICD-10-CM

## 2019-10-29 MED ORDER — IBUPROFEN 400 MG PO TABS: 400.0000 mg | ORAL_TABLET | Freq: Four times a day (QID) | ORAL | 0 refills | Status: DC | PRN

## 2019-10-29 MED ORDER — CYCLOBENZAPRINE HCL 10 MG PO TABS: 10.0000 mg | ORAL_TABLET | Freq: Three times a day (TID) | ORAL | 0 refills | Status: DC | PRN

## 2019-10-29 MED ORDER — ACETAMINOPHEN 500 MG PO TABS: 500.0000 mg | ORAL_TABLET | Freq: Four times a day (QID) | ORAL | 0 refills | Status: DC | PRN

## 2019-10-29 NOTE — ED Triage Notes (Signed)
Pt presents to UC with lower back pain, not know trauma. Pt denies any numbness or tingling buttocks or legs.

## 2019-11-01 NOTE — ED Provider Notes (Signed)
[place]    CSN: [id number] Arrival date & time: 10/29/19  1503      History   Chief Complaint Chief Complaint  Patient presents with  . Back Pain    HPI Kelly Newton is a 54 y.o. female with history of hypertension comes to urgent care with complaints of lower back pain of a few weeks duration.  Patient says the pain is throbbing.  Currently 5 out of 10.  She denies any radiation into the legs.  No urinary difficulties.  Patient denies any trauma.  Pain is aggravated by movement and stretching as well as palpation.  Over-the-counter medication has provided temporary partial relief .  No fever or chills.  No weakness in the lower extremities.  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  Her seat at work does not have good back support.  HPI  Past Medical History:  Diagnosis Date  . GERD (gastroesophageal reflux disease)   . Hypertension     There are no problems to display for this patient.   Past Surgical History:  Procedure Laterality Date  . CESAREAN SECTION  1996   /w epidural  . SUBMANDIBULAR GLAND EXCISION Right 10/15/2013   Procedure: EXCISION SUBMANDIBULAR STONE ;  Surgeon: Ruby Cola, MD;  Location: [facility];  Service: ENT;  Laterality: Right;    OB History   No obstetric history on file.      Home Medications    Prior to Admission medications   Medication Sig Start Date End Date Taking? Authorizing Provider  acetaminophen (TYLENOL) 500 MG tablet Take 1 tablet (500 mg total) by mouth every 6 (six) hours as needed. 10/29/19   [name], Myrene Galas, MD  cyclobenzaprine (FLEXERIL) 10 MG tablet Take 1 tablet (10 mg total) by mouth 3 (three) times daily as needed for muscle spasms. 10/29/19   [name], Myrene Galas, MD  hydrochlorothiazide (HYDRODIURIL) 12.5 MG tablet Take 1 tablet (12.5 mg total) by mouth daily. 09/24/19   [name], PA-C  ibuprofen (ADVIL) 400 MG tablet Take 1 tablet (400 mg total) by mouth every 6  (six) hours as needed. 10/29/19   [name], MD    Family History Family History  Problem Relation Age of Onset  . Diabetes Mother   . Hypertension Mother   . Diabetes Father   . Hypertension Father     Social History Social History   Tobacco Use  . Smoking status: Never Smoker  . Smokeless tobacco: Never Used  Substance Use Topics  . Alcohol use: No  . Drug use: No     Allergies   Patient has no known allergies.   Review of Systems Review of Systems  Constitutional: Negative for activity change, fatigue and fever.  HENT: Negative.   Respiratory: Negative.   Cardiovascular: Negative for palpitations and leg swelling.  Gastrointestinal: Negative.   Musculoskeletal: Positive for back pain. Negative for arthralgias, joint swelling and myalgias.  Skin: Negative.   Neurological: Negative for dizziness, numbness and headaches.     Physical Exam Triage Vital Signs ED Triage Vitals  Enc Vitals Group     BP 10/29/19 1517 (!) 146/97     Pulse Rate 10/29/19 1517 85     Resp 10/29/19 1517 18     Temp 10/29/19 1517 98.9 F (37.2 C)     Temp Source 10/29/19 1517 Oral     SpO2 10/29/19 1517 99 %     Weight --  Height --      Head Circumference --      Peak Flow --      Pain Score 10/29/19 1516 5     Pain Loc --      Pain Edu? --      Excl. in GC? --    No data found.  Updated Vital Signs BP (!) 146/97 (BP Location: Right Arm)   Pulse 85   Temp 98.9 F (37.2 C) (Oral)   Resp 18   SpO2 99%   Visual Acuity Right Eye Distance:   Left Eye Distance:   Bilateral Distance:    Right Eye Near:   Left Eye Near:    Bilateral Near:     Physical Exam Vitals and nursing note reviewed.  Constitutional:      General: She is not in acute distress.    Appearance: She is not ill-appearing.  Cardiovascular:     Rate and Rhythm: Normal rate and regular rhythm.  Pulmonary:     Effort: Pulmonary effort is normal.     Breath sounds: Normal breath sounds.    Abdominal:     General: Bowel sounds are normal.     Palpations: Abdomen is soft.  Musculoskeletal:        General: Tenderness present. No deformity or signs of injury. Normal range of motion.  Skin:    Capillary Refill: Capillary refill takes less than 2 seconds.  Neurological:     General: No focal deficit present.     Mental Status: She is alert and oriented to person, place, and time.     Cranial Nerves: No cranial nerve deficit.     Sensory: No sensory deficit.      UC Treatments / Results  Labs (all labs ordered are listed, but only abnormal results are displayed) Labs Reviewed - No data to display  EKG   Radiology No results found.  Procedures Procedures (including critical care time)  Medications Ordered in UC Medications - No data to display  Initial Impression / Assessment and Plan / UC Course  I have reviewed the triage vital signs and the nursing notes.  Pertinent labs & imaging results that were available during my care of the patient were reviewed by me and considered in my medical decision making (see chart for details).     1.  Musculoskeletal low back pain: Gentle range of motion exercises Patient is advised to get a comfortable seat since she sits for long hours a day Heat therapy Alternate ibuprofen with Tylenol as needed for pain Flexeril as needed for muscle spasm Return precautions given No indication for x-rays  Final Clinical Impressions(s) / UC Diagnoses   Final diagnoses:  Musculoskeletal back pain   Discharge Instructions   None    ED Prescriptions    Medication Sig Dispense Auth. Provider   ibuprofen (ADVIL) 400 MG tablet Take 1 tablet (400 mg total) by mouth every 6 (six) hours as needed. 30 tablet [name], Britta Mccreedy, MD   acetaminophen (TYLENOL) 500 MG tablet Take 1 tablet (500 mg total) by mouth every 6 (six) hours as needed. 30 tablet [name], Britta Mccreedy, MD   cyclobenzaprine (FLEXERIL) 10 MG tablet Take 1 tablet (10 mg  total) by mouth 3 (three) times daily as needed for muscle spasms. 30 tablet [name], Britta Mccreedy, MD     PDMP not reviewed this encounter.   Merrilee Jansky, MD 11/01/19 [phone]

## 2020-01-07 ENCOUNTER — Ambulatory Visit (HOSPITAL_COMMUNITY)
Admission: EM | Admit: 2020-01-07 | Discharge: 2020-01-07 | Disposition: A | Discharge status: Home / Self Care | Payer: BC Managed Care – PPO | Attending: Family Medicine | Admitting: Family Medicine

## 2020-01-07 ENCOUNTER — Encounter (HOSPITAL_COMMUNITY): Payer: Self-pay

## 2020-01-07 ENCOUNTER — Other Ambulatory Visit: Payer: Self-pay

## 2020-01-07 DIAGNOSIS — G44209 Tension-type headache, unspecified, not intractable: Secondary | ICD-10-CM

## 2020-01-07 DIAGNOSIS — M542 Cervicalgia: Secondary | ICD-10-CM | POA: Diagnosis not present

## 2020-01-07 MED ORDER — HYDROCODONE-ACETAMINOPHEN 5-325 MG PO TABS: 1.0000 | ORAL_TABLET | Freq: Four times a day (QID) | ORAL | 0 refills | Status: DC | PRN

## 2020-01-07 MED ORDER — IBUPROFEN 800 MG PO TABS: 800.0000 mg | ORAL_TABLET | Freq: Three times a day (TID) | ORAL | 0 refills | Status: DC

## 2020-01-07 MED ORDER — TIZANIDINE HCL 4 MG PO TABS: 4.0000 mg | ORAL_TABLET | Freq: Four times a day (QID) | ORAL | 0 refills | Status: DC | PRN

## 2020-01-07 NOTE — Discharge Instructions (Addendum)
Take ibuprofen 3 x a day with food If needed take pain medicine and muscle relaxer medicine These are useful to rest Heat to painful muscle Return if not improving in a few days, or call PCP
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
## 2021-10-15 ENCOUNTER — Other Ambulatory Visit: Payer: Self-pay

## 2021-10-15 ENCOUNTER — Encounter (HOSPITAL_COMMUNITY): Payer: Self-pay | Admitting: Emergency Medicine

## 2021-10-15 ENCOUNTER — Ambulatory Visit (HOSPITAL_COMMUNITY)
Admission: EM | Admit: 2021-10-15 | Discharge: 2021-10-15 | Disposition: A | Discharge status: Home / Self Care | Payer: BC Managed Care – PPO | Attending: Family Medicine | Admitting: Family Medicine

## 2021-10-15 ENCOUNTER — Ambulatory Visit (INDEPENDENT_AMBULATORY_CARE_PROVIDER_SITE_OTHER): Payer: BC Managed Care – PPO

## 2021-10-15 DIAGNOSIS — M79674 Pain in right toe(s): Secondary | ICD-10-CM

## 2021-10-15 MED ORDER — IBUPROFEN 800 MG PO TABS: 800.0000 mg | ORAL_TABLET | Freq: Three times a day (TID) | ORAL | 0 refills | Status: DC

## 2021-10-15 NOTE — ED Triage Notes (Signed)
Pain for 2 months involving right foot.  Toe next to great toe is painful, slight soreness to great toe .  No know injury.  Patient does wear steel toed shoes ?

## 2021-10-15 NOTE — ED Provider Notes (Addendum)
?[facility] ? ? ? ?CSN: [id number] ?Arrival date & time: 10/15/21  1001 ? ? ?  ? ?History   ?Chief Complaint ?Chief Complaint  ?Patient presents with  ? Foot Pain  ? ? ?HPI ?Kelly Newton is a 56 y.o. female.  ? ? ?Foot Pain ? ?Here for a 71-month history of pain in her right second toe.  She thought it was possibly a rash at first but now it seems to be hurting her in the middle of the toe.  No known injury ? ?Past Medical History:  ?Diagnosis Date  ? GERD (gastroesophageal reflux disease)   ? Hypertension   ? ? ?There are no problems to display for this patient. ? ? ?Past Surgical History:  ?Procedure Laterality Date  ? CESAREAN SECTION  1996  ? /w epidural  ? SUBMANDIBULAR GLAND EXCISION Right 10/15/2013  ? Procedure: EXCISION SUBMANDIBULAR STONE ;  Surgeon: Melvenia Beam, MD;  Location: [facility] OR;  Service: ENT;  Laterality: Right;  ? ? ?OB History   ?No obstetric history on file. ?  ? ? ? ?Home Medications   ? ?Prior to Admission medications   ?Medication Sig Start Date End Date Taking? Authorizing Provider  ?acetaminophen (TYLENOL) 500 MG tablet Take 1 tablet (500 mg total) by mouth every 6 (six) hours as needed. 10/29/19   LampteyBritta Mccreedy, MD  ?hydrochlorothiazide (HYDRODIURIL) 12.5 MG tablet Take 1 tablet (12.5 mg total) by mouth daily. 09/24/19   Wallis Bamberg, PA-C  ?ibuprofen (ADVIL) 800 MG tablet Take 1 tablet (800 mg total) by mouth 3 (three) times daily. 10/15/21   Zenia Resides, MD  ? ? ?Family History ?Family History  ?Problem Relation Age of Onset  ? Diabetes Mother   ? Hypertension Mother   ? Diabetes Father   ? Hypertension Father   ? ? ?Social History ?Social History  ? ?Tobacco Use  ? Smoking status: Never  ? Smokeless tobacco: Never  ?Vaping Use  ? Vaping Use: Never used  ?Substance Use Topics  ? Alcohol use: No  ? Drug use: No  ? ? ? ?Allergies   ?Patient has no known allergies. ? ? ?Review of Systems ?Review of Systems ? ? ?Physical Exam ?Triage Vital Signs ?ED Triage Vitals  ?Enc  Vitals Group  ?   BP 10/15/21 1116 (!) 164/92  ?   Pulse Rate 10/15/21 1116 71  ?   Resp 10/15/21 1116 18  ?   Temp 10/15/21 1116 98.3 ?F (36.8 ?C)  ?   Temp Source 10/15/21 1116 Oral  ?   SpO2 10/15/21 1116 96 %  ?   Weight --   ?   Height --   ?   Head Circumference --   ?   Peak Flow --   ?   Pain Score 10/15/21 1113 7  ?   Pain Loc --   ?   Pain Edu? --   ?   Excl. in GC? --   ? ?No data found. ? ?Updated Vital Signs ?BP (!) 164/92 (BP Location: Left Arm) Comment: has not had medicine today  Pulse 71   Temp 98.3 ?F (36.8 ?C) (Oral)   Resp 18   SpO2 96%  ? ?Visual Acuity ?Right Eye Distance:   ?Left Eye Distance:   ?Bilateral Distance:   ? ?Right Eye Near:   ?Left Eye Near:    ?Bilateral Near:    ? ?Physical Exam ?Vitals reviewed.  ?Constitutional:   ?  General: She is not in acute distress. ?   Appearance: She is not toxic-appearing.  ?Musculoskeletal:  ?   Comments: There is a slight angulated deformity of the right second toe at the inner phalangeal joint.  It is tender and slightly swollen.  ?Skin: ?   Capillary Refill: Capillary refill takes less than 2 seconds.  ?   Coloration: Skin is not jaundiced or pale.  ?Neurological:  ?   Mental Status: She is oriented to person, place, and time.  ?Psychiatric:     ?   Behavior: Behavior normal.  ? ? ? ?UC Treatments / Results  ?Labs ?(all labs ordered are listed, but only abnormal results are displayed) ?Labs Reviewed  ?URIC ACID  ?CBC  ? ? ?EKG ? ? ?Radiology ?DG Toe 2nd Right ? ?Result Date: 10/15/2021 ?CLINICAL DATA:  Right second toe pain for 2 months. No known injury. Some deformity. EXAM: RIGHT SECOND TOE COMPARISON:  Right foot radiographs 07/18/2015 FINDINGS: There is again mild lateral angulation of the distal aspect of second toe. The cortices are intact. No acute fracture or dislocation. IMPRESSION: No acute fracture or cortical erosion. Electronically Signed   By: Neita Garnet M.D.   On: 10/15/2021 11:44   ? ?Procedures ?Procedures (including  critical care time) ? ?Medications Ordered in UC ?Medications - No data to display ? ?Initial Impression / Assessment and Plan / UC Course  ?I have reviewed the triage vital signs and the nursing notes. ? ?Pertinent labs & imaging results that were available during my care of the patient were reviewed by me and considered in my medical decision making (see chart for details). ? ?  ? ?X-rays negative of the toe except there is continued angulation on x-ray done in 2017.  No fracture.  We will do a uric acid today, and she has already been referred to podiatry by her primary care, see care everywhere tab ? ?After AVS was done, pt refused the remainder of the lab work; lab orders cancelled ?Final Clinical Impressions(s) / UC Diagnoses  ? ?Final diagnoses:  ?Pain in toe of right foot  ? ? ? ?Discharge Instructions   ? ?  ?The x-rays did not show any fracture.  The angulation of the second toe was there on x-rays done in 2017 for another issue ? ?Take ibuprofen 800 mg--1 tab every 8 hours as needed for pain.  ? ?We are drawing some blood work today--a blood count and a uric acid.  Staff will call you if anything is significantly abnormal ? ?Your primary care office has already referred you to podiatry. They did a chemistry panel and a cholesterol, no blood count or uric acid. ? ? ? ? ?ED Prescriptions   ? ? Medication Sig Dispense Auth. Provider  ? ibuprofen (ADVIL) 800 MG tablet Take 1 tablet (800 mg total) by mouth 3 (three) times daily. 21 tablet Marlinda Mike Janace Aris, MD  ? ?  ? ?PDMP not reviewed this encounter. ?  ?Zenia Resides, MD ?10/15/21 1151 ? ?  ?Zenia Resides, MD ?10/15/21 1157 ? ?  ?Zenia Resides, MD ?10/15/21 1213 ? ?

## 2021-10-15 NOTE — Discharge Instructions (Addendum)
The x-rays did not show any fracture.  The angulation of the second toe was there on x-rays done in 2017 for another issue ? ?Take ibuprofen 800 mg--1 tab every 8 hours as needed for pain.  ? ?We are drawing some blood work today--a blood count and a uric acid.  Staff will call you if anything is significantly abnormal ? ?Your primary care office has already referred you to podiatry. They did a chemistry panel and a cholesterol, no blood count or uric acid. ?

## 2022-02-21 ENCOUNTER — Ambulatory Visit (INDEPENDENT_AMBULATORY_CARE_PROVIDER_SITE_OTHER): Payer: BC Managed Care – PPO | Admitting: Podiatry

## 2022-02-21 ENCOUNTER — Ambulatory Visit (INDEPENDENT_AMBULATORY_CARE_PROVIDER_SITE_OTHER): Payer: BC Managed Care – PPO

## 2022-02-21 ENCOUNTER — Encounter: Payer: Self-pay | Admitting: Podiatry

## 2022-02-21 DIAGNOSIS — M7751 Other enthesopathy of right foot: Secondary | ICD-10-CM

## 2022-02-21 DIAGNOSIS — M2041 Other hammer toe(s) (acquired), right foot: Secondary | ICD-10-CM | POA: Diagnosis not present

## 2022-02-21 DIAGNOSIS — M779 Enthesopathy, unspecified: Secondary | ICD-10-CM | POA: Diagnosis not present

## 2022-02-21 DIAGNOSIS — M79671 Pain in right foot: Secondary | ICD-10-CM | POA: Diagnosis not present

## 2022-02-21 MED ORDER — TRIAMCINOLONE ACETONIDE 10 MG/ML IJ SUSP
10.0000 mg | Freq: Once | INTRAMUSCULAR | Status: AC
  Administered 2022-02-21: 10 mg

## 2022-02-21 MED ORDER — TRIAMCINOLONE ACETONIDE 10 MG/ML IJ SUSP: 10.0000 mg | Freq: Once | INTRAMUSCULAR | Status: DC

## 2022-02-21 NOTE — Progress Notes (Signed)
Subjective:   Patient ID: Kelly Newton, female   DOB: 56 y.o.   MRN: [id number]   HPI Patient states she is developed a lot of pain between the hallux and second digit right and feels like there is enlargement of the bone and a lesion formation and its been inflamed and sore.  Its been going on for several months she has tried wider shoes but does have to wear steel toes at work.  Patient does not smoke likes to be active   Review of Systems  All other systems reviewed and are negative.       Objective:  Physical Exam Vitals and nursing note reviewed.  Constitutional:      Appearance: She is well-developed.  Pulmonary:     Effort: Pulmonary effort is normal.  Musculoskeletal:        General: Normal range of motion.  Skin:    General: Skin is warm.  Neurological:     Mental Status: She is alert.     Neurovascular status intact muscle strength adequate range of motion adequate with inflammation and pain of the medial side digit to right with inflammation around the joint surface.  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

## 2022-02-27 ENCOUNTER — Other Ambulatory Visit: Payer: Self-pay | Admitting: Podiatry

## 2022-02-27 DIAGNOSIS — M779 Enthesopathy, unspecified: Secondary | ICD-10-CM

## 2023-08-15 IMAGING — DX DG TOE 2ND 2+V*R*
3 series · 3 of 3 positions shown · non-contrast
Comparison: Right foot radiographs 07/18/2015

CLINICAL DATA: Right second toe pain for 2 months. No known injury.
Some deformity.

EXAM:
RIGHT SECOND TOE

[toe ap]
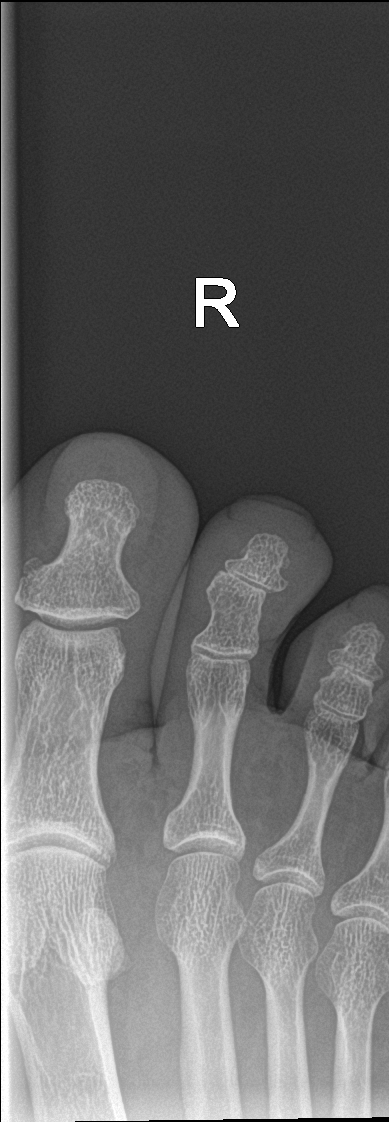

[toe obl]
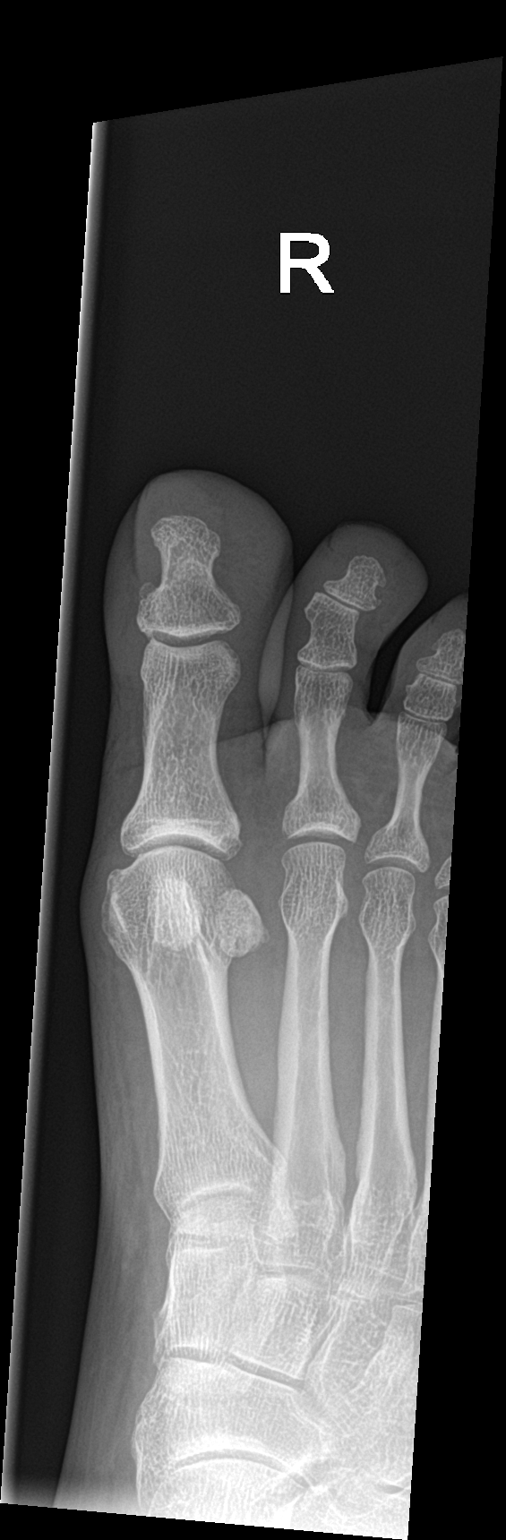

[toe lat]
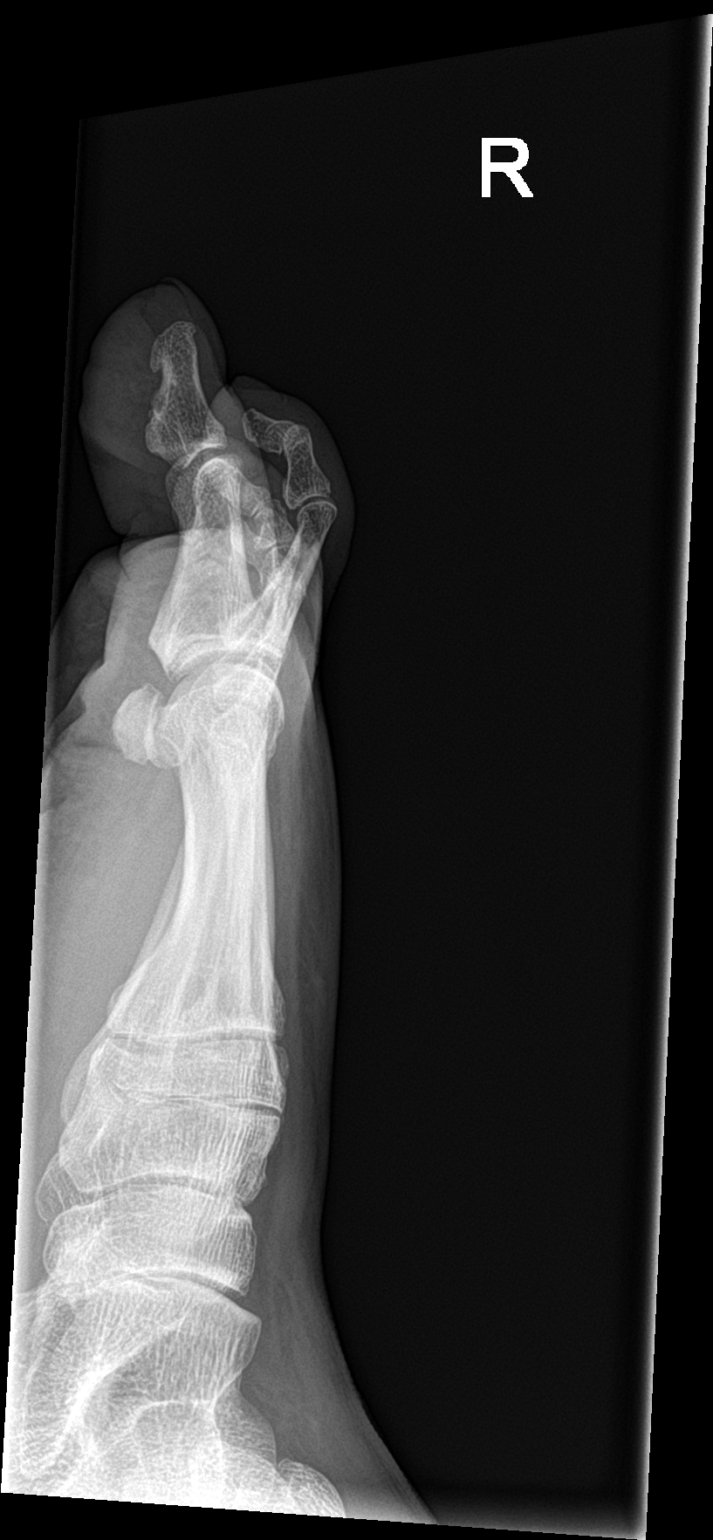

[3 of 3 positions shown; findings below may reference images not displayed]

FINDINGS: There is again mild lateral angulation of the distal aspect of
second toe. The cortices are intact. No acute fracture or
dislocation.
IMPRESSION: No acute fracture or cortical erosion.
